# Patient Record
Sex: Female | Born: 1995 | Hispanic: Yes | Marital: Single | Smoking: Never smoker
Health system: Southern US, Community
[De-identification: ages and names within clinical notes are randomized; demographics above are authoritative.]

## PROBLEM LIST (undated history)

## (undated) ENCOUNTER — Inpatient Hospital Stay (HOSPITAL_COMMUNITY): Payer: Self-pay

## (undated) DIAGNOSIS — K8020 Calculus of gallbladder without cholecystitis without obstruction: Secondary | ICD-10-CM

## (undated) DIAGNOSIS — N83209 Unspecified ovarian cyst, unspecified side: Secondary | ICD-10-CM

## (undated) DIAGNOSIS — Z789 Other specified health status: Secondary | ICD-10-CM

---

## 2015-07-30 NOTE — L&D Delivery Note (Signed)
Delivery Note At 7:43 PM a viable female was delivered via Vaginal, Spontaneous Delivery (Presentation: Left Occiput Anterior).  APGAR: 9, 9; weight  .   Placenta status: Intact, Spontaneous.  Cord: 3 vessels with the following complications: None.  Cord pH:   Anesthesia: Epidural  Episiotomy: None Lacerations:  vaginal Suture Repair: 3.0 vicryl Est. Blood Loss (mL):    Mom to postpartum.  Baby to Couplet care / Skin to Skin.  Clemmons,Lori Grissett 11/23/2015, 8:21 PM

## 2015-08-16 ENCOUNTER — Encounter: Payer: Self-pay | Admitting: Obstetrics and Gynecology

## 2015-08-16 ENCOUNTER — Ambulatory Visit (INDEPENDENT_AMBULATORY_CARE_PROVIDER_SITE_OTHER): Payer: Self-pay | Admitting: Obstetrics and Gynecology

## 2015-08-16 VITALS — BP 107/70 | HR 96 | Ht 64.0 in | Wt 183.0 lb

## 2015-08-16 DIAGNOSIS — Z3492 Encounter for supervision of normal pregnancy, unspecified, second trimester: Secondary | ICD-10-CM

## 2015-08-16 DIAGNOSIS — Z3402 Encounter for supervision of normal first pregnancy, second trimester: Secondary | ICD-10-CM

## 2015-08-16 DIAGNOSIS — Z113 Encounter for screening for infections with a predominantly sexual mode of transmission: Secondary | ICD-10-CM

## 2015-08-16 DIAGNOSIS — Z36 Encounter for antenatal screening of mother: Secondary | ICD-10-CM

## 2015-08-16 DIAGNOSIS — O0930 Supervision of pregnancy with insufficient antenatal care, unspecified trimester: Secondary | ICD-10-CM | POA: Insufficient documentation

## 2015-08-16 DIAGNOSIS — O0932 Supervision of pregnancy with insufficient antenatal care, second trimester: Secondary | ICD-10-CM

## 2015-08-16 DIAGNOSIS — Z3482 Encounter for supervision of other normal pregnancy, second trimester: Secondary | ICD-10-CM

## 2015-08-16 DIAGNOSIS — Z3400 Encounter for supervision of normal first pregnancy, unspecified trimester: Secondary | ICD-10-CM | POA: Insufficient documentation

## 2015-08-16 NOTE — Progress Notes (Signed)
   Subjective:    Jessica Riddle is a G2P0 [redacted]w[redacted]d being seen today for her first obstetrical visit.  Her obstetrical history is significant for first pregnancy and late onset to care at 26 weeks. Patient does intend to breast feed. Pregnancy history fully reviewed.  Patient reports no complaints.  Filed Vitals:   08/16/15 0957 08/16/15 0958  BP: 107/70   Pulse: 96   Height:   (1.626 m)  Weight: 183 lb (83.008 kg)     HISTORY: OB History  Gravida Para Term Preterm AB SAB TAB Ectopic Multiple Living  2             # Outcome Date GA Lbr Len/2nd Weight Sex Delivery Anes PTL Lv  2 Current           1 Gravida              History reviewed. No pertinent past medical history. History reviewed. No pertinent past surgical history. History reviewed. No pertinent family history.   Exam    Not performed Fundal height 26    Assessment:    Pregnancy: G2P0 Patient Active Problem List   Diagnosis Date Noted  . Supervision of normal first pregnancy, antepartum 08/16/2015  . Insufficient prenatal care 08/16/2015        Plan:     Initial labs drawn. Prenatal vitamins. Problem list reviewed and updated. Genetic Screening discussed : too late.  Ultrasound discussed; fetal survey: ordered with Dr. Gaynell Face  Follow up in 2 weeks for glucola and tdap 50% of 30 min visit spent on counseling and coordination of care.     Jessica Riddle 08/16/2015

## 2015-08-16 NOTE — Patient Instructions (Addendum)
Contraception Choices Contraception (birth control) is the use of any methods or devices to prevent pregnancy. Below are some methods to help avoid pregnancy. HORMONAL METHODS   Contraceptive implant. This is a thin, plastic tube containing progesterone hormone. It does not contain estrogen hormone. Your health care provider inserts the tube in the inner part of the upper arm. The tube can remain in place for up to 3 years. After 3 years, the implant must be removed. The implant prevents the ovaries from releasing an egg (ovulation), thickens the cervical mucus to prevent sperm from entering the uterus, and thins the lining of the inside of the uterus.  Progesterone-only injections. These injections are given every 3 months by your health care provider to prevent pregnancy. This synthetic progesterone hormone stops the ovaries from releasing eggs. It also thickens cervical mucus and changes the uterine lining. This makes it harder for sperm to survive in the uterus.  Birth control pills. These pills contain estrogen and progesterone hormone. They work by preventing the ovaries from releasing eggs (ovulation). They also cause the cervical mucus to thicken, preventing the sperm from entering the uterus. Birth control pills are prescribed by a health care provider.Birth control pills can also be used to treat heavy periods.  Minipill. This type of birth control pill contains only the progesterone hormone. They are taken every day of each month and must be prescribed by your health care provider.  Birth control patch. The patch contains hormones similar to those in birth control pills. It must be changed once a week and is prescribed by a health care provider.  Vaginal ring. The ring contains hormones similar to those in birth control pills. It is left in the vagina for 3 weeks, removed for 1 week, and then a new one is put back in place. The patient must be comfortable inserting and removing the ring  from the vagina.A health care provider's prescription is necessary.  Emergency contraception. Emergency contraceptives prevent pregnancy after unprotected sexual intercourse. This pill can be taken right after sex or up to 5 days after unprotected sex. It is most effective the sooner you take the pills after having sexual intercourse. Most emergency contraceptive pills are available without a prescription. Check with your pharmacist. Do not use emergency contraception as your only form of birth control. BARRIER METHODS   Female condom. This is a thin sheath (latex or rubber) that is worn over the penis during sexual intercourse. It can be used with spermicide to increase effectiveness.  Female condom. This is a soft, loose-fitting sheath that is put into the vagina before sexual intercourse.  Diaphragm. This is a soft, latex, dome-shaped barrier that must be fitted by a health care provider. It is inserted into the vagina, along with a spermicidal jelly. It is inserted before intercourse. The diaphragm should be left in the vagina for 6 to 8 hours after intercourse.  Cervical cap. This is a round, soft, latex or plastic cup that fits over the cervix and must be fitted by a health care provider. The cap can be left in place for up to 48 hours after intercourse.  Sponge. This is a soft, circular piece of polyurethane foam. The sponge has spermicide in it. It is inserted into the vagina after wetting it and before sexual intercourse.  Spermicides. These are chemicals that kill or block sperm from entering the cervix and uterus. They come in the form of creams, jellies, suppositories, foam, or tablets. They do not require a   prescription. They are inserted into the vagina with an applicator before having sexual intercourse. The process must be repeated every time you have sexual intercourse. INTRAUTERINE CONTRACEPTION  Intrauterine device (IUD). This is a T-shaped device that is put in a woman's uterus  during a menstrual period to prevent pregnancy. There are 2 types:  Copper IUD. This type of IUD is wrapped in copper wire and is placed inside the uterus. Copper makes the uterus and fallopian tubes produce a fluid that kills sperm. It can stay in place for 10 years.  Hormone IUD. This type of IUD contains the hormone progestin (synthetic progesterone). The hormone thickens the cervical mucus and prevents sperm from entering the uterus, and it also thins the uterine lining to prevent implantation of a fertilized egg. The hormone can weaken or kill the sperm that get into the uterus. It can stay in place for 3-5 years, depending on which type of IUD is used. PERMANENT METHODS OF CONTRACEPTION  Female tubal ligation. This is when the woman's fallopian tubes are surgically sealed, tied, or blocked to prevent the egg from traveling to the uterus.  Hysteroscopic sterilization. This involves placing a small coil or insert into each fallopian tube. Your doctor uses a technique called hysteroscopy to do the procedure. The device causes scar tissue to form. This results in permanent blockage of the fallopian tubes, so the sperm cannot fertilize the egg. It takes about 3 months after the procedure for the tubes to become blocked. You must use another form of birth control for these 3 months.  Female sterilization. This is when the female has the tubes that carry sperm tied off (vasectomy).This blocks sperm from entering the vagina during sexual intercourse. After the procedure, the man can still ejaculate fluid (semen). NATURAL PLANNING METHODS  Natural family planning. This is not having sexual intercourse or using a barrier method (condom, diaphragm, cervical cap) on days the woman could become pregnant.  Calendar method. This is keeping track of the length of each menstrual cycle and identifying when you are fertile.  Ovulation method. This is avoiding sexual intercourse during ovulation.  Symptothermal  method. This is avoiding sexual intercourse during ovulation, using a thermometer and ovulation symptoms.  Post-ovulation method. This is timing sexual intercourse after you have ovulated. Regardless of which type or method of contraception you choose, it is important that you use condoms to protect against the transmission of sexually transmitted infections (STIs). Talk with your health care provider about which form of contraception is most appropriate for you.   This information is not intended to replace advice given to you by your health care provider. Make sure you discuss any questions you have with your health care provider.   Document Released: 07/15/2005 Document Revised: 07/20/2013 Document Reviewed: 01/07/2013 Elsevier Interactive Patient Education 2016 [employer].  Second Trimester of Pregnancy The second trimester is from week 13 through week 28, months 4 through 6. The second trimester is often a time when you feel your best. Your body has also adjusted to being pregnant, and you begin to feel better physically. Usually, morning sickness has lessened or quit completely, you may have more energy, and you may have an increase in appetite. The second trimester is also a time when the fetus is growing rapidly. At the end of the sixth month, the fetus is about 9 inches long and weighs about 1 pounds. You will likely begin to feel the baby move (quickening) between 18 and 20 weeks of the  pregnancy. BODY CHANGES Your body goes through many changes during pregnancy. The changes vary from woman to woman.   Your weight will continue to increase. You will notice your lower abdomen bulging out.  You may begin to get stretch marks on your hips, abdomen, and breasts.  You may develop headaches that can be relieved by medicines approved by your health care provider.  You may urinate more often because the fetus is pressing on your bladder.  You may develop or continue to have heartburn as  a result of your pregnancy.  You may develop constipation because certain hormones are causing the muscles that push waste through your intestines to slow down.  You may develop hemorrhoids or swollen, bulging veins (varicose veins).  You may have back pain because of the weight gain and pregnancy hormones relaxing your joints between the bones in your pelvis and as a result of a shift in weight and the muscles that support your balance.  Your breasts will continue to grow and be tender.  Your gums may bleed and may be sensitive to brushing and flossing.  Dark spots or blotches (chloasma, mask of pregnancy) may develop on your face. This will likely fade after the baby is born.  A dark line from your belly button to the pubic area (linea nigra) may appear. This will likely fade after the baby is born.  You may have changes in your hair. These can include thickening of your hair, rapid growth, and changes in texture. Some women also have hair loss during or after pregnancy, or hair that feels dry or thin. Your hair will most likely return to normal after your baby is born. WHAT TO EXPECT AT YOUR PRENATAL VISITS During a routine prenatal visit:  You will be weighed to make sure you and the fetus are growing normally.  Your blood pressure will be taken.  Your abdomen will be measured to track your baby's growth.  The fetal heartbeat will be listened to.  Any test results from the previous visit will be discussed. Your health care provider may ask you:  How you are feeling.  If you are feeling the baby move.  If you have had any abnormal symptoms, such as leaking fluid, bleeding, severe headaches, or abdominal cramping.  If you are using any tobacco products, including cigarettes, chewing tobacco, and electronic cigarettes.  If you have any questions. Other tests that may be performed during your second trimester include:  Blood tests that check for:  Low iron levels  (anemia).  Gestational diabetes (between 24 and 28 weeks).  Rh antibodies.  Urine tests to check for infections, diabetes, or protein in the urine.  An ultrasound to confirm the proper growth and development of the baby.  An amniocentesis to check for possible genetic problems.  Fetal screens for spina bifida and Down syndrome.  HIV (human immunodeficiency virus) testing. Routine prenatal testing includes screening for HIV, unless you choose not to have this test. HOME CARE INSTRUCTIONS   Avoid all smoking, herbs, alcohol, and unprescribed drugs. These chemicals affect the formation and growth of the baby.  Do not use any tobacco products, including cigarettes, chewing tobacco, and electronic cigarettes. If you need help quitting, ask your health care provider. You may receive counseling support and other resources to help you quit.  Follow your health care provider's instructions regarding medicine use. There are medicines that are either safe or unsafe to take during pregnancy.  Exercise only as directed by your health  care provider. Experiencing uterine cramps is a good sign to stop exercising.  Continue to eat regular, healthy meals.  Wear a good support bra for breast tenderness.  Do not use hot tubs, steam rooms, or saunas.  Wear your seat belt at all times when driving.  Avoid raw meat, uncooked cheese, cat litter boxes, and soil used by cats. These carry germs that can cause birth defects in the baby.  Take your prenatal vitamins.  Take 1500-2000 mg of calcium daily starting at the 20th week of pregnancy until you deliver your baby.  Try taking a stool softener (if your health care provider approves) if you develop constipation. Eat more high-fiber foods, such as fresh vegetables or fruit and whole grains. Drink plenty of fluids to keep your urine clear or pale yellow.  Take warm sitz baths to soothe any pain or discomfort caused by hemorrhoids. Use hemorrhoid cream  if your health care provider approves.  If you develop varicose veins, wear support hose. Elevate your feet for 15 minutes, 3-4 times a day. Limit salt in your diet.  Avoid heavy lifting, wear low heel shoes, and practice good posture.  Rest with your legs elevated if you have leg cramps or low back pain.  Visit your dentist if you have not gone yet during your pregnancy. Use a soft toothbrush to brush your teeth and be gentle when you floss.  A sexual relationship may be continued unless your health care provider directs you otherwise.  Continue to go to all your prenatal visits as directed by your health care provider. SEEK MEDICAL CARE IF:   You have dizziness.  You have mild pelvic cramps, pelvic pressure, or nagging pain in the abdominal area.  You have persistent nausea, vomiting, or diarrhea.  You have a bad smelling vaginal discharge.  You have pain with urination. SEEK IMMEDIATE MEDICAL CARE IF:   You have a fever.  You are leaking fluid from your vagina.  You have spotting or bleeding from your vagina.  You have severe abdominal cramping or pain.  You have rapid weight gain or loss.  You have shortness of breath with chest pain.  You notice sudden or extreme swelling of your face, hands, ankles, feet, or legs.  You have not felt your baby move in over an hour.  You have severe headaches that do not go away with medicine.  You have vision changes.   This information is not intended to replace advice given to you by your health care provider. Make sure you discuss any questions you have with your health care provider.   Document Released: 07/09/2001 Document Revised: 08/05/2014 Document Reviewed: 09/15/2012 Elsevier Interactive Patient Education [employer]  Contraception Choices Contraception (birth control) is the use of any methods or devices to prevent pregnancy. Below are some methods to help avoid pregnancy. HORMONAL METHODS   Contraceptive  implant. This is a thin, plastic tube containing progesterone hormone. It does not contain estrogen hormone. Your health care provider inserts the tube in the inner part of the upper arm. The tube can remain in place for up to 3 years. After 3 years, the implant must be removed. The implant prevents the ovaries from releasing an egg (ovulation), thickens the cervical mucus to prevent sperm from entering the uterus, and thins the lining of the inside of the uterus.  Progesterone-only injections. These injections are given every 3 months by your health care provider to prevent pregnancy. This synthetic progesterone hormone stops the ovaries  from releasing eggs. It also thickens cervical mucus and changes the uterine lining. This makes it harder for sperm to survive in the uterus.  Birth control pills. These pills contain estrogen and progesterone hormone. They work by preventing the ovaries from releasing eggs (ovulation). They also cause the cervical mucus to thicken, preventing the sperm from entering the uterus. Birth control pills are prescribed by a health care provider.Birth control pills can also be used to treat heavy periods.  Minipill. This type of birth control pill contains only the progesterone hormone. They are taken every day of each month and must be prescribed by your health care provider.  Birth control patch. The patch contains hormones similar to those in birth control pills. It must be changed once a week and is prescribed by a health care provider.  Vaginal ring. The ring contains hormones similar to those in birth control pills. It is left in the vagina for 3 weeks, removed for 1 week, and then a new one is put back in place. The patient must be comfortable inserting and removing the ring from the vagina.A health care provider's prescription is necessary.  Emergency contraception. Emergency contraceptives prevent pregnancy after unprotected sexual intercourse. This pill can be  taken right after sex or up to 5 days after unprotected sex. It is most effective the sooner you take the pills after having sexual intercourse. Most emergency contraceptive pills are available without a prescription. Check with your pharmacist. Do not use emergency contraception as your only form of birth control. BARRIER METHODS   Female condom. This is a thin sheath (latex or rubber) that is worn over the penis during sexual intercourse. It can be used with spermicide to increase effectiveness.  Female condom. This is a soft, loose-fitting sheath that is put into the vagina before sexual intercourse.  Diaphragm. This is a soft, latex, dome-shaped barrier that must be fitted by a health care provider. It is inserted into the vagina, along with a spermicidal jelly. It is inserted before intercourse. The diaphragm should be left in the vagina for 6 to 8 hours after intercourse.  Cervical cap. This is a round, soft, latex or plastic cup that fits over the cervix and must be fitted by a health care provider. The cap can be left in place for up to 48 hours after intercourse.  Sponge. This is a soft, circular piece of polyurethane foam. The sponge has spermicide in it. It is inserted into the vagina after wetting it and before sexual intercourse.  Spermicides. These are chemicals that kill or block sperm from entering the cervix and uterus. They come in the form of creams, jellies, suppositories, foam, or tablets. They do not require a prescription. They are inserted into the vagina with an applicator before having sexual intercourse. The process must be repeated every time you have sexual intercourse. INTRAUTERINE CONTRACEPTION  Intrauterine device (IUD). This is a T-shaped device that is put in a woman's uterus during a menstrual period to prevent pregnancy. There are 2 types:  Copper IUD. This type of IUD is wrapped in copper wire and is placed inside the uterus. Copper makes the uterus and fallopian  tubes produce a fluid that kills sperm. It can stay in place for 10 years.  Hormone IUD. This type of IUD contains the hormone progestin (synthetic progesterone). The hormone thickens the cervical mucus and prevents sperm from entering the uterus, and it also thins the uterine lining to prevent implantation of a fertilized egg. The  hormone can weaken or kill the sperm that get into the uterus. It can stay in place for 3-5 years, depending on which type of IUD is used. PERMANENT METHODS OF CONTRACEPTION  Female tubal ligation. This is when the woman's fallopian tubes are surgically sealed, tied, or blocked to prevent the egg from traveling to the uterus.  Hysteroscopic sterilization. This involves placing a small coil or insert into each fallopian tube. Your doctor uses a technique called hysteroscopy to do the procedure. The device causes scar tissue to form. This results in permanent blockage of the fallopian tubes, so the sperm cannot fertilize the egg. It takes about 3 months after the procedure for the tubes to become blocked. You must use another form of birth control for these 3 months.  Female sterilization. This is when the female has the tubes that carry sperm tied off (vasectomy).This blocks sperm from entering the vagina during sexual intercourse. After the procedure, the man can still ejaculate fluid (semen). NATURAL PLANNING METHODS  Natural family planning. This is not having sexual intercourse or using a barrier method (condom, diaphragm, cervical cap) on days the woman could become pregnant.  Calendar method. This is keeping track of the length of each menstrual cycle and identifying when you are fertile.  Ovulation method. This is avoiding sexual intercourse during ovulation.  Symptothermal method. This is avoiding sexual intercourse during ovulation, using a thermometer and ovulation symptoms.  Post-ovulation method. This is timing sexual intercourse after you have  ovulated. Regardless of which type or method of contraception you choose, it is important that you use condoms to protect against the transmission of sexually transmitted infections (STIs). Talk with your health care provider about which form of contraception is most appropriate for you.   This information is not intended to replace advice given to you by your health care provider. Make sure you discuss any questions you have with your health care provider.   Document Released: 07/15/2005 Document Revised: 07/20/2013 Document Reviewed: 01/07/2013 Elsevier Interactive Patient Education [employer]  Breastfeeding Deciding to breastfeed is one of the best choices you can make for you and your baby. A change in hormones during pregnancy causes your breast tissue to grow and increases the number and size of your milk ducts. These hormones also allow proteins, sugars, and fats from your blood supply to make breast milk in your milk-producing glands. Hormones prevent breast milk from being released before your baby is born as well as prompt milk flow after birth. Once breastfeeding has begun, thoughts of your baby, as well as his or her sucking or crying, can stimulate the release of milk from your milk-producing glands.  BENEFITS OF BREASTFEEDING For Your Baby  Your first milk (colostrum) helps your baby's digestive system function better.  There are antibodies in your milk that help your baby fight off infections.  Your baby has a lower incidence of asthma, allergies, and sudden infant death syndrome.  The nutrients in breast milk are better for your baby than infant formulas and are designed uniquely for your baby's needs.  Breast milk improves your baby's brain development.  Your baby is less likely to develop other conditions, such as childhood obesity, asthma, or type 2 diabetes mellitus. For You  Breastfeeding helps to create a very special bond between you and your  baby.  Breastfeeding is convenient. Breast milk is always available at the correct temperature and costs nothing.  Breastfeeding helps to burn calories and helps you lose the weight  gained during pregnancy.  Breastfeeding makes your uterus contract to its prepregnancy size faster and slows bleeding (lochia) after you give birth.   Breastfeeding helps to lower your risk of developing type 2 diabetes mellitus, osteoporosis, and breast or ovarian cancer later in life. SIGNS THAT YOUR BABY IS HUNGRY Early Signs of Hunger  Increased alertness or activity.  Stretching.  Movement of the head from side to side.  Movement of the head and opening of the mouth when the corner of the mouth or cheek is stroked (rooting).  Increased sucking sounds, smacking lips, cooing, sighing, or squeaking.  Hand-to-mouth movements.  Increased sucking of fingers or hands. Late Signs of Hunger  Fussing.  Intermittent crying. Extreme Signs of Hunger Signs of extreme hunger will require calming and consoling before your baby will be able to breastfeed successfully. Do not wait for the following signs of extreme hunger to occur before you initiate breastfeeding:  Restlessness.  A loud, strong cry.  Screaming. BREASTFEEDING BASICS Breastfeeding Initiation  Find a comfortable place to sit or lie down, with your neck and back well supported.  Place a pillow or rolled up blanket under your baby to bring him or her to the level of your breast (if you are seated). Nursing pillows are specially designed to help support your arms and your baby while you breastfeed.  Make sure that your baby's abdomen is facing your abdomen.  Gently massage your breast. With your fingertips, massage from your chest wall toward your nipple in a circular motion. This encourages milk flow. You may need to continue this action during the feeding if your milk flows slowly.  Support your breast with 4 fingers underneath and your  thumb above your nipple. Make sure your fingers are well away from your nipple and your baby's mouth.  Stroke your baby's lips gently with your finger or nipple.  When your baby's mouth is open wide enough, quickly bring your baby to your breast, placing your entire nipple and as much of the colored area around your nipple (areola) as possible into your baby's mouth.  More areola should be visible above your baby's upper lip than below the lower lip.  Your baby's tongue should be between his or her lower gum and your breast.  Ensure that your baby's mouth is correctly positioned around your nipple (latched). Your baby's lips should create a seal on your breast and be turned out (everted).  It is common for your baby to suck about 2-3 minutes in order to start the flow of breast milk. Latching Teaching your baby how to latch on to your breast properly is very important. An improper latch can cause nipple pain and decreased milk supply for you and poor weight gain in your baby. Also, if your baby is not latched onto your nipple properly, he or she may swallow some air during feeding. This can make your baby fussy. Burping your baby when you switch breasts during the feeding can help to get rid of the air. However, teaching your baby to latch on properly is still the best way to prevent fussiness from swallowing air while breastfeeding. Signs that your baby has successfully latched on to your nipple:  Silent tugging or silent sucking, without causing you pain.  Swallowing heard between every 3-4 sucks.  Muscle movement above and in front of his or her ears while sucking. Signs that your baby has not successfully latched on to nipple:  Sucking sounds or smacking sounds from your baby  while breastfeeding.  Nipple pain. If you think your baby has not latched on correctly, slip your finger into the corner of your baby's mouth to break the suction and place it between your baby's gums. Attempt  breastfeeding initiation again. Signs of Successful Breastfeeding Signs from your baby:  A gradual decrease in the number of sucks or complete cessation of sucking.  Falling asleep.  Relaxation of his or her body.  Retention of a small amount of milk in his or her mouth.  Letting go of your breast by himself or herself. Signs from you:  Breasts that have increased in firmness, weight, and size 1-3 hours after feeding.  Breasts that are softer immediately after breastfeeding.  Increased milk volume, as well as a change in milk consistency and color by the fifth day of breastfeeding.  Nipples that are not sore, cracked, or bleeding. Signs That Your Pecola Leisure is Getting Enough Milk  Wetting at least 3 diapers in a 24-hour period. The urine should be clear and pale yellow by age 215 days.  At least 3 stools in a 24-hour period by age 215 days. The stool should be soft and yellow.  At least 3 stools in a 24-hour period by age 21 days. The stool should be seedy and yellow.  No loss of weight greater than 10% of birth weight during the first 81 days of age.  Average weight gain of 4-7 ounces (113-198 g) per week after age 69 days.  Consistent daily weight gain by age 215 days, without weight loss after the age of 2 weeks. After a feeding, your baby may spit up a small amount. This is common. BREASTFEEDING FREQUENCY AND DURATION Frequent feeding will help you make more milk and can prevent sore nipples and breast engorgement. Breastfeed when you feel the need to reduce the fullness of your breasts or when your baby shows signs of hunger. This is called "breastfeeding on demand." Avoid introducing a pacifier to your baby while you are working to establish breastfeeding (the first 4-6 weeks after your baby is born). After this time you may choose to use a pacifier. Research has shown that pacifier use during the first year of a baby's life decreases the risk of sudden infant death syndrome  (SIDS). Allow your baby to feed on each breast as long as he or she wants. Breastfeed until your baby is finished feeding. When your baby unlatches or falls asleep while feeding from the first breast, offer the second breast. Because newborns are often sleepy in the first few weeks of life, you may need to awaken your baby to get him or her to feed. Breastfeeding times will vary from baby to baby. However, the following rules can serve as a guide to help you ensure that your baby is properly fed:  Newborns (babies 19 weeks of age or younger) may breastfeed every 1-3 hours.  Newborns should not go longer than 3 hours during the day or 5 hours during the night without breastfeeding.  You should breastfeed your baby a minimum of 8 times in a 24-hour period until you begin to introduce solid foods to your baby at around 34 months of age. BREAST MILK PUMPING Pumping and storing breast milk allows you to ensure that your baby is exclusively fed your breast milk, even at times when you are unable to breastfeed. This is especially important if you are going back to work while you are still breastfeeding or when you are not able to be  present during feedings. Your lactation consultant can give you guidelines on how long it is safe to store breast milk. A breast pump is a machine that allows you to pump milk from your breast into a sterile bottle. The pumped breast milk can then be stored in a refrigerator or freezer. Some breast pumps are operated by hand, while others use electricity. Ask your lactation consultant which type will work best for you. Breast pumps can be purchased, but some hospitals and breastfeeding support groups lease breast pumps on a monthly basis. A lactation consultant can teach you how to hand express breast milk, if you prefer not to use a pump. CARING FOR YOUR BREASTS WHILE YOU BREASTFEED Nipples can become dry, cracked, and sore while breastfeeding. The following recommendations can help  keep your breasts moisturized and healthy:  Avoid using soap on your nipples.  Wear a supportive bra. Although not required, special nursing bras and tank tops are designed to allow access to your breasts for breastfeeding without taking off your entire bra or top. Avoid wearing underwire-style bras or extremely tight bras.  Air dry your nipples for 3-57minutes after each feeding.  Use only cotton bra pads to absorb leaked breast milk. Leaking of breast milk between feedings is normal.  Use lanolin on your nipples after breastfeeding. Lanolin helps to maintain your skin's normal moisture barrier. If you use pure lanolin, you do not need to wash it off before feeding your baby again. Pure lanolin is not toxic to your baby. You may also hand express a few drops of breast milk and gently massage that milk into your nipples and allow the milk to air dry. In the first few weeks after giving birth, some women experience extremely full breasts (engorgement). Engorgement can make your breasts feel heavy, warm, and tender to the touch. Engorgement peaks within 3-5 days after you give birth. The following recommendations can help ease engorgement:  Completely empty your breasts while breastfeeding or pumping. You may want to start by applying warm, moist heat (in the shower or with warm water-soaked hand towels) just before feeding or pumping. This increases circulation and helps the milk flow. If your baby does not completely empty your breasts while breastfeeding, pump any extra milk after he or she is finished.  Wear a snug bra (nursing or regular) or tank top for 1-2 days to signal your body to slightly decrease milk production.  Apply ice packs to your breasts, unless this is too uncomfortable for you.  Make sure that your baby is latched on and positioned properly while breastfeeding. If engorgement persists after 48 hours of following these recommendations, contact your health care provider or a  Advertising copywriter. OVERALL HEALTH CARE RECOMMENDATIONS WHILE BREASTFEEDING  Eat healthy foods. Alternate between meals and snacks, eating 3 of each per day. Because what you eat affects your breast milk, some of the foods may make your baby more irritable than usual. Avoid eating these foods if you are sure that they are negatively affecting your baby.  Drink milk, fruit juice, and water to satisfy your thirst (about 10 glasses a day).  Rest often, relax, and continue to take your prenatal vitamins to prevent fatigue, stress, and anemia.  Continue breast self-awareness checks.  Avoid chewing and smoking tobacco. Chemicals from cigarettes that pass into breast milk and exposure to secondhand smoke may harm your baby.  Avoid alcohol and drug use, including marijuana. Some medicines that may be harmful to your baby can pass through  breast milk. It is important to ask your health care provider before taking any medicine, including all over-the-counter and prescription medicine as well as vitamin and herbal supplements. It is possible to become pregnant while breastfeeding. If birth control is desired, ask your health care provider about options that will be safe for your baby. SEEK MEDICAL CARE IF:  You feel like you want to stop breastfeeding or have become frustrated with breastfeeding.  You have painful breasts or nipples.  Your nipples are cracked or bleeding.  Your breasts are red, tender, or warm.  You have a swollen area on either breast.  You have a fever or chills.  You have nausea or vomiting.  You have drainage other than breast milk from your nipples.  Your breasts do not become full before feedings by the fifth day after you give birth.  You feel sad and depressed.  Your baby is too sleepy to eat well.  Your baby is having trouble sleeping.   Your baby is wetting less than 3 diapers in a 24-hour period.  Your baby has less than 3 stools in a 24-hour  period.  Your baby's skin or the white part of his or her eyes becomes yellow.   Your baby is not gaining weight by 53 days of age. SEEK IMMEDIATE MEDICAL CARE IF:  Your baby is overly tired (lethargic) and does not want to wake up and feed.  Your baby develops an unexplained fever.   This information is not intended to replace advice given to you by your health care provider. Make sure you discuss any questions you have with your health care provider.   Document Released: 07/15/2005 Document Revised: 04/05/2015 Document Reviewed: 01/06/2013 Elsevier Interactive Patient Education [employer]

## 2015-08-17 LAB — PRENATAL PROFILE (SOLSTAS)
Antibody Screen: NEGATIVE
BASOS PCT: 0 % (ref 0–1)
Basophils Absolute: 0 10*3/uL (ref 0.0–0.1)
Eosinophils Absolute: 0.1 10*3/uL (ref 0.0–0.7)
Eosinophils Relative: 1 % (ref 0–5)
HEMATOCRIT: 30.1 % — AB (ref 36.0–46.0)
HEMOGLOBIN: 9.9 g/dL — AB (ref 12.0–15.0)
HEP B S AG: NEGATIVE
HIV: NONREACTIVE
LYMPHS PCT: 17 % (ref 12–46)
Lymphs Abs: 2.4 10*3/uL (ref 0.7–4.0)
MCH: 28.3 pg (ref 26.0–34.0)
MCHC: 32.9 g/dL (ref 30.0–36.0)
MCV: 86 fL (ref 78.0–100.0)
MONO ABS: 0.7 10*3/uL (ref 0.1–1.0)
MONOS PCT: 5 % (ref 3–12)
MPV: 10.9 fL (ref 8.6–12.4)
NEUTROS ABS: 10.9 10*3/uL — AB (ref 1.7–7.7)
NEUTROS PCT: 77 % (ref 43–77)
Platelets: 285 10*3/uL (ref 150–400)
RBC: 3.5 MIL/uL — AB (ref 3.87–5.11)
RDW: 14.7 % (ref 11.5–15.5)
Rh Type: POSITIVE
Rubella: 7.73 Index — ABNORMAL HIGH (ref <0.90)
WBC: 14.2 10*3/uL — AB (ref 4.0–10.5)

## 2015-08-17 LAB — CULTURE, OB URINE
Colony Count: NO GROWTH
Organism ID, Bacteria: NO GROWTH

## 2015-08-17 LAB — URINE CYTOLOGY ANCILLARY ONLY
Chlamydia: NEGATIVE
Neisseria Gonorrhea: NEGATIVE

## 2015-08-30 ENCOUNTER — Encounter: Payer: Self-pay | Admitting: Certified Nurse Midwife

## 2015-08-31 ENCOUNTER — Encounter: Payer: Self-pay | Admitting: *Deleted

## 2015-08-31 ENCOUNTER — Encounter: Payer: Self-pay | Admitting: Obstetrics and Gynecology

## 2015-08-31 ENCOUNTER — Ambulatory Visit (INDEPENDENT_AMBULATORY_CARE_PROVIDER_SITE_OTHER): Payer: Self-pay | Admitting: Obstetrics and Gynecology

## 2015-08-31 VITALS — BP 113/74 | HR 103 | Wt 188.0 lb

## 2015-08-31 DIAGNOSIS — Z3483 Encounter for supervision of other normal pregnancy, third trimester: Secondary | ICD-10-CM

## 2015-08-31 DIAGNOSIS — Z36 Encounter for antenatal screening of mother: Secondary | ICD-10-CM

## 2015-08-31 DIAGNOSIS — Z3493 Encounter for supervision of normal pregnancy, unspecified, third trimester: Secondary | ICD-10-CM

## 2015-08-31 DIAGNOSIS — Z23 Encounter for immunization: Secondary | ICD-10-CM

## 2015-08-31 DIAGNOSIS — O0933 Supervision of pregnancy with insufficient antenatal care, third trimester: Secondary | ICD-10-CM

## 2015-08-31 DIAGNOSIS — Z3403 Encounter for supervision of normal first pregnancy, third trimester: Secondary | ICD-10-CM

## 2015-08-31 LAB — CBC
HEMATOCRIT: 30.2 % — AB (ref 36.0–46.0)
Hemoglobin: 10 g/dL — ABNORMAL LOW (ref 12.0–15.0)
MCH: 28.2 pg (ref 26.0–34.0)
MCHC: 33.1 g/dL (ref 30.0–36.0)
MCV: 85.1 fL (ref 78.0–100.0)
MPV: 10.2 fL (ref 8.6–12.4)
PLATELETS: 296 10*3/uL (ref 150–400)
RBC: 3.55 MIL/uL — AB (ref 3.87–5.11)
RDW: 13.8 % (ref 11.5–15.5)
WBC: 13.3 10*3/uL — AB (ref 4.0–10.5)

## 2015-08-31 NOTE — Progress Notes (Addendum)
Subjective:  Jessica Riddle is a 20 y.o. G2P0 at [redacted]w[redacted]d being seen today for ongoing prenatal care.  She is currently monitored for the following issues for this high-risk pregnancy and has Supervision of normal first pregnancy, antepartum and Insufficient prenatal care on her problem list.  Patient reports no complaints.  Contractions: Not present. Vag. Bleeding: None.  Movement: Present. Denies leaking of fluid.   The following portions of the patient's history were reviewed and updated as appropriate: allergies, current medications, past family history, past medical history, past social history, past surgical history and problem list. Problem list updated.  Objective:   Filed Vitals:   08/31/15 0852  BP: 113/74  Pulse: 103  Weight: 188 lb (85.276 kg)    Fetal Status: Fetal Heart Rate (bpm): 147   Movement: Present     General:  Alert, oriented and cooperative. Patient is in no acute distress.  Skin: Skin is warm and dry. No rash noted.   Cardiovascular: Normal heart rate noted  Respiratory: Normal respiratory effort, no problems with respiration noted  Abdomen: Soft, gravid, appropriate for gestational age. Pain/Pressure: Absent     Pelvic: Vag. Bleeding: None Vag D/C Character: Thin   Cervical exam deferred        Extremities: Normal range of motion.  Edema: None  Mental Status: Normal mood and affect. Normal behavior. Normal judgment and thought content.   Urinalysis: Urine Protein: Negative Urine Glucose: Negative  Assessment and Plan:  Pregnancy: G2P0 at [redacted]w[redacted]d  1. Supervision of normal pregnancy in third trimester Patient doing well. 1 hour GCT and labs today - Tdap vaccine greater than or equal to 7yo IM - CBC - RPR - HIV antibody - Glucose Tolerance, 1 HR (50g)  2. Insufficient prenatal care, third trimester   3. Supervision of normal first pregnancy, antepartum, third trimester   Preterm labor symptoms and general obstetric precautions including but not limited to  vaginal bleeding, contractions, leaking of fluid and fetal movement were reviewed in detail with the patient. Please refer to After Visit Summary for other counseling recommendations.  Return in about 2 weeks (around 09/14/2015).   Catalina Antigua, MD

## 2015-09-01 LAB — GLUCOSE TOLERANCE, 1 HOUR (50G) W/O FASTING: GLUCOSE 1 HOUR GTT: 93 mg/dL (ref 70–140)

## 2015-09-01 LAB — HIV ANTIBODY (ROUTINE TESTING W REFLEX): HIV: NONREACTIVE

## 2015-09-01 LAB — RPR

## 2015-09-13 ENCOUNTER — Encounter: Payer: Self-pay | Admitting: Certified Nurse Midwife

## 2015-09-19 ENCOUNTER — Encounter: Payer: Self-pay | Admitting: Obstetrics and Gynecology

## 2015-09-19 ENCOUNTER — Ambulatory Visit (INDEPENDENT_AMBULATORY_CARE_PROVIDER_SITE_OTHER): Payer: Self-pay | Admitting: Obstetrics and Gynecology

## 2015-09-19 VITALS — BP 117/73 | HR 112 | Wt 186.0 lb

## 2015-09-19 DIAGNOSIS — Z3403 Encounter for supervision of normal first pregnancy, third trimester: Secondary | ICD-10-CM

## 2015-09-19 DIAGNOSIS — O0933 Supervision of pregnancy with insufficient antenatal care, third trimester: Secondary | ICD-10-CM

## 2015-09-19 NOTE — Progress Notes (Signed)
Subjective:  Jessica Riddle is a 20 y.o. G2P0 at [redacted]w[redacted]d being seen today for ongoing prenatal care.  She is currently monitored for the following issues for this low-risk pregnancy and has Supervision of normal first pregnancy, antepartum and Insufficient prenatal care on her problem list.  Patient reports no complaints.  Contractions: Not present. Vag. Bleeding: None.  Movement: Present. Denies leaking of fluid.   The following portions of the patient's history were reviewed and updated as appropriate: allergies, current medications, past family history, past medical history, past social history, past surgical history and problem list. Problem list updated.  Objective:   Filed Vitals:   09/19/15 1514  BP: 117/73  Pulse: 112  Weight: 186 lb (84.369 kg)    Fetal Status: Fetal Heart Rate (bpm): 138 Fundal Height: 31 cm Movement: Present     General:  Alert, oriented and cooperative. Patient is in no acute distress.  Skin: Skin is warm and dry. No rash noted.   Cardiovascular: Normal heart rate noted  Respiratory: Normal respiratory effort, no problems with respiration noted  Abdomen: Soft, gravid, appropriate for gestational age. Pain/Pressure: Present     Pelvic: Vag. Bleeding: None     Cervical exam deferred        Extremities: Normal range of motion.  Edema: Trace  Mental Status: Normal mood and affect. Normal behavior. Normal judgment and thought content.   Urinalysis:      Assessment and Plan:  Pregnancy: G2P0 at [redacted]w[redacted]d  1. Insufficient prenatal care, third trimester   2. Supervision of normal first pregnancy, antepartum, third trimester Patient doing well without complaints.  Normal 1 hr at last visit  Preterm labor symptoms and general obstetric precautions including but not limited to vaginal bleeding, contractions, leaking of fluid and fetal movement were reviewed in detail with the patient. Please refer to After Visit Summary for other counseling recommendations.  Return  in about 2 weeks (around 10/03/2015).   Catalina Antigua, MD

## 2015-10-03 ENCOUNTER — Ambulatory Visit (INDEPENDENT_AMBULATORY_CARE_PROVIDER_SITE_OTHER): Payer: Self-pay | Admitting: Obstetrics & Gynecology

## 2015-10-03 ENCOUNTER — Encounter (INDEPENDENT_AMBULATORY_CARE_PROVIDER_SITE_OTHER): Payer: Self-pay

## 2015-10-03 ENCOUNTER — Encounter: Payer: Self-pay | Admitting: Obstetrics & Gynecology

## 2015-10-03 VITALS — BP 100/67 | HR 108 | Wt 180.0 lb

## 2015-10-03 DIAGNOSIS — Z3403 Encounter for supervision of normal first pregnancy, third trimester: Secondary | ICD-10-CM

## 2015-10-03 DIAGNOSIS — O0933 Supervision of pregnancy with insufficient antenatal care, third trimester: Secondary | ICD-10-CM

## 2015-10-03 NOTE — Patient Instructions (Signed)
Contraception Choices Contraception (birth control) is the use of any methods or devices to prevent pregnancy. Below are some methods to help avoid pregnancy. HORMONAL METHODS   Contraceptive implant. This is a thin, plastic tube containing progesterone hormone. It does not contain estrogen hormone. Your health care provider inserts the tube in the inner part of the upper arm. The tube can remain in place for up to 3 years. After 3 years, the implant must be removed. The implant prevents the ovaries from releasing an egg (ovulation), thickens the cervical mucus to prevent sperm from entering the uterus, and thins the lining of the inside of the uterus.  Progesterone-only injections. These injections are given every 3 months by your health care provider to prevent pregnancy. This synthetic progesterone hormone stops the ovaries from releasing eggs. It also thickens cervical mucus and changes the uterine lining. This makes it harder for sperm to survive in the uterus.  Birth control pills. These pills contain estrogen and progesterone hormone. They work by preventing the ovaries from releasing eggs (ovulation). They also cause the cervical mucus to thicken, preventing the sperm from entering the uterus. Birth control pills are prescribed by a health care provider.Birth control pills can also be used to treat heavy periods.  Minipill. This type of birth control pill contains only the progesterone hormone. They are taken every day of each month and must be prescribed by your health care provider.  Birth control patch. The patch contains hormones similar to those in birth control pills. It must be changed once a week and is prescribed by a health care provider.  Vaginal ring. The ring contains hormones similar to those in birth control pills. It is left in the vagina for 3 weeks, removed for 1 week, and then a new one is put back in place. The patient must be comfortable inserting and removing the ring  from the vagina.A health care provider's prescription is necessary.  Emergency contraception. Emergency contraceptives prevent pregnancy after unprotected sexual intercourse. This pill can be taken right after sex or up to 5 days after unprotected sex. It is most effective the sooner you take the pills after having sexual intercourse. Most emergency contraceptive pills are available without a prescription. Check with your pharmacist. Do not use emergency contraception as your only form of birth control. BARRIER METHODS   Female condom. This is a thin sheath (latex or rubber) that is worn over the penis during sexual intercourse. It can be used with spermicide to increase effectiveness.  Female condom. This is a soft, loose-fitting sheath that is put into the vagina before sexual intercourse.  Diaphragm. This is a soft, latex, dome-shaped barrier that must be fitted by a health care provider. It is inserted into the vagina, along with a spermicidal jelly. It is inserted before intercourse. The diaphragm should be left in the vagina for 6 to 8 hours after intercourse.  Cervical cap. This is a round, soft, latex or plastic cup that fits over the cervix and must be fitted by a health care provider. The cap can be left in place for up to 48 hours after intercourse.  Sponge. This is a soft, circular piece of polyurethane foam. The sponge has spermicide in it. It is inserted into the vagina after wetting it and before sexual intercourse.  Spermicides. These are chemicals that kill or block sperm from entering the cervix and uterus. They come in the form of creams, jellies, suppositories, foam, or tablets. They do not require a   prescription. They are inserted into the vagina with an applicator before having sexual intercourse. The process must be repeated every time you have sexual intercourse. INTRAUTERINE CONTRACEPTION  Intrauterine device (IUD). This is a T-shaped device that is put in a woman's uterus  during a menstrual period to prevent pregnancy. There are 2 types:  Copper IUD. This type of IUD is wrapped in copper wire and is placed inside the uterus. Copper makes the uterus and fallopian tubes produce a fluid that kills sperm. It can stay in place for 10 years.  Hormone IUD. This type of IUD contains the hormone progestin (synthetic progesterone). The hormone thickens the cervical mucus and prevents sperm from entering the uterus, and it also thins the uterine lining to prevent implantation of a fertilized egg. The hormone can weaken or kill the sperm that get into the uterus. It can stay in place for 3-5 years, depending on which type of IUD is used. PERMANENT METHODS OF CONTRACEPTION  Female tubal ligation. This is when the woman's fallopian tubes are surgically sealed, tied, or blocked to prevent the egg from traveling to the uterus.  Hysteroscopic sterilization. This involves placing a small coil or insert into each fallopian tube. Your doctor uses a technique called hysteroscopy to do the procedure. The device causes scar tissue to form. This results in permanent blockage of the fallopian tubes, so the sperm cannot fertilize the egg. It takes about 3 months after the procedure for the tubes to become blocked. You must use another form of birth control for these 3 months.  Female sterilization. This is when the female has the tubes that carry sperm tied off (vasectomy).This blocks sperm from entering the vagina during sexual intercourse. After the procedure, the man can still ejaculate fluid (semen). NATURAL PLANNING METHODS  Natural family planning. This is not having sexual intercourse or using a barrier method (condom, diaphragm, cervical cap) on days the woman could become pregnant.  Calendar method. This is keeping track of the length of each menstrual cycle and identifying when you are fertile.  Ovulation method. This is avoiding sexual intercourse during ovulation.  Symptothermal  method. This is avoiding sexual intercourse during ovulation, using a thermometer and ovulation symptoms.  Post-ovulation method. This is timing sexual intercourse after you have ovulated. Regardless of which type or method of contraception you choose, it is important that you use condoms to protect against the transmission of sexually transmitted infections (STIs). Talk with your health care provider about which form of contraception is most appropriate for you.   This information is not intended to replace advice given to you by your health care provider. Make sure you discuss any questions you have with your health care provider.   Document Released: 07/15/2005 Document Revised: 07/20/2013 Document Reviewed: 01/07/2013 Elsevier Interactive Patient Education 2016 [employer] Inc[employer]  

## 2015-10-03 NOTE — Progress Notes (Signed)
Reviewed with pt contraception. She is still undecided.

## 2015-10-03 NOTE — Progress Notes (Signed)
Subjective:  Jessica Riddle is a [age] y.o. G2P0 at 7268w6d being seen today for ongoing prenatal care.  She is currently monitored for the following issues for this high-risk pregnancy and has Supervision of normal first pregnancy, antepartum and Insufficient prenatal care on her problem list.  Patient reports no complaints.  Contractions: Not present. Vag. Bleeding: None.  Movement: Present. Denies leaking of fluid.   The following portions of the patient's history were reviewed and updated as appropriate: allergies, current medications, past family history, past medical history, past social history, past surgical history and problem list. Problem list updated.  Objective:   Filed Vitals:   10/03/15 1435  BP: 100/67  Pulse: 108  Weight: 180 lb (81.647 kg)    Fetal Status: Fetal Heart Rate (bpm): 150   Movement: Present     General:  Alert, oriented and cooperative. Patient is in no acute distress.  Skin: Skin is warm and dry. No rash noted.   Cardiovascular: Normal heart rate noted  Respiratory: Normal respiratory effort, no problems with respiration noted  Abdomen: Soft, gravid, appropriate for gestational age. Pain/Pressure: Present     Pelvic: Vag. Bleeding: None     Cervical exam deferred        Extremities: Normal range of motion.  Edema: Trace  Mental Status: Normal mood and affect. Normal behavior. Normal judgment and thought content.   Urinalysis: Urine Protein: Negative Urine Glucose: Negative  Assessment and Plan:  Pregnancy: G2P0 at 1568w6d  1. Supervision of normal first pregnancy, antepartum, third trimester  2. Insufficient prenatal care, third trimester  Pt was worried about weight loss.  She feel the same.  Explained to her the change in the scale.  Her FH is also 34.  Not a problem.  Preterm labor symptoms and general obstetric precautions including but not limited to vaginal bleeding, contractions, leaking of fluid and fetal movement were reviewed in detail with the  patient. Please refer to After Visit Summary for other counseling recommendations.  Return in about 2 weeks (around 10/17/2015).   Willodean Rosenthalarolyn Harraway-Smith, MD

## 2015-10-17 ENCOUNTER — Ambulatory Visit (INDEPENDENT_AMBULATORY_CARE_PROVIDER_SITE_OTHER): Payer: Self-pay | Admitting: Obstetrics & Gynecology

## 2015-10-17 VITALS — BP 117/79 | HR 118 | Wt 179.0 lb

## 2015-10-17 DIAGNOSIS — O0933 Supervision of pregnancy with insufficient antenatal care, third trimester: Secondary | ICD-10-CM

## 2015-10-17 DIAGNOSIS — Z3403 Encounter for supervision of normal first pregnancy, third trimester: Secondary | ICD-10-CM

## 2015-10-17 NOTE — Progress Notes (Signed)
Subjective:  Jessica Riddle is a 20 y.o. SH G2P0 (girl)  at 4227w6d being seen today for ongoing prenatal care.  She is currently monitored for the following issues for this low-risk pregnancy and has Supervision of normal first pregnancy, antepartum and Insufficient prenatal care on her problem list.  Patient reports no complaints.  Contractions: Not present. Vag. Bleeding: None.  Movement: Present. Denies leaking of fluid.   The following portions of the patient's history were reviewed and updated as appropriate: allergies, current medications, past family history, past medical history, past social history, past surgical history and problem list. Problem list updated.  Objective:   Filed Vitals:   10/17/15 1442  BP: 117/79  Pulse: 118  Weight: 179 lb (81.194 kg)    Fetal Status: Fetal Heart Rate (bpm): 143   Movement: Present     General:  Alert, oriented and cooperative. Patient is in no acute distress.  Skin: Skin is warm and dry. No rash noted.   Cardiovascular: Normal heart rate noted  Respiratory: Normal respiratory effort, no problems with respiration noted  Abdomen: Soft, gravid, appropriate for gestational age. Pain/Pressure: Absent     Pelvic: Vag. Bleeding: None Vag D/C Character: Thin   Cervical exam deferred        Extremities: Normal range of motion.  Edema: None  Mental Status: Normal mood and affect. Normal behavior. Normal judgment and thought content.   Urinalysis: Urine Protein: Trace Urine Glucose: Negative  Assessment and Plan:  Pregnancy: G2P0 at 6027w6d  1. Insufficient prenatal care, third trimester   2. Supervision of normal first pregnancy, antepartum, third trimester - cervical cultures at next visit  Preterm labor symptoms and general obstetric precautions including but not limited to vaginal bleeding, contractions, leaking of fluid and fetal movement were reviewed in detail with the patient. Please refer to After Visit Summary for other counseling  recommendations.  Return in about 1 week (around 10/24/2015).   Allie BossierMyra C Evva Din, MD

## 2015-10-25 ENCOUNTER — Ambulatory Visit (INDEPENDENT_AMBULATORY_CARE_PROVIDER_SITE_OTHER): Payer: Self-pay | Admitting: Certified Nurse Midwife

## 2015-10-25 VITALS — BP 104/65 | HR 85 | Wt 182.0 lb

## 2015-10-25 DIAGNOSIS — Z113 Encounter for screening for infections with a predominantly sexual mode of transmission: Secondary | ICD-10-CM

## 2015-10-25 DIAGNOSIS — Z3403 Encounter for supervision of normal first pregnancy, third trimester: Secondary | ICD-10-CM

## 2015-10-25 DIAGNOSIS — Z36 Encounter for antenatal screening of mother: Secondary | ICD-10-CM

## 2015-10-25 DIAGNOSIS — O0933 Supervision of pregnancy with insufficient antenatal care, third trimester: Secondary | ICD-10-CM

## 2015-10-25 LAB — OB RESULTS CONSOLE GBS: STREP GROUP B AG: NEGATIVE

## 2015-10-25 LAB — OB RESULTS CONSOLE GC/CHLAMYDIA: GC PROBE AMP, GENITAL: NEGATIVE

## 2015-10-25 NOTE — Progress Notes (Signed)
Subjective:  Jessica Riddle is a 20 y.o. G2P0 at 3368w0d being seen today for ongoing prenatal care.  She is currently monitored for the following issues for this low-risk pregnancy and has Supervision of normal first pregnancy, antepartum and Insufficient prenatal care on her problem list.  Patient reports no complaints.  Contractions: Not present. Vag. Bleeding: None.  Movement: Present. Denies leaking of fluid.   The following portions of the patient's history were reviewed and updated as appropriate: allergies, current medications, past family history, past medical history, past social history, past surgical history and problem list. Problem list updated.  Objective:   Filed Vitals:   10/25/15 1438  BP: 104/65  Pulse: 85  Weight: 182 lb (82.555 kg)    Fetal Status: Fetal Heart Rate (bpm): 138   Movement: Present     General:  Alert, oriented and cooperative. Patient is in no acute distress.  Skin: Skin is warm and dry. No rash noted.   Cardiovascular: Normal heart rate noted  Respiratory: Normal respiratory effort, no problems with respiration noted  Abdomen: Soft, gravid, appropriate for gestational age. Pain/Pressure: Absent     Pelvic: Vag. Bleeding: None Vag D/C Character: Thin   Cervical exam performed        Extremities: Normal range of motion.  Edema: Trace  Mental Status: Normal mood and affect. Normal behavior. Normal judgment and thought content.   Urinalysis: Urine Protein: Negative Urine Glucose: Negative  Assessment and Plan:  Pregnancy: G2P0 at 3268w0d  1. Supervision of normal first pregnancy, antepartum, third trimester GBS collected GC and ch collected  2. Insufficient prenatal care, third trimester   Preterm labor symptoms and general obstetric precautions including but not limited to vaginal bleeding, contractions, leaking of fluid and fetal movement were reviewed in detail with the patient. Please refer to After Visit Summary for other counseling  recommendations.  Return in about 1 week (around 11/01/2015).   Rhea PinkLori A Maleka Contino, CNM

## 2015-10-25 NOTE — Patient Instructions (Signed)

## 2015-10-26 LAB — GC/CHLAMYDIA PROBE AMP (~~LOC~~) NOT AT ARMC
Chlamydia: NEGATIVE
Neisseria Gonorrhea: NEGATIVE

## 2015-10-27 LAB — CULTURE, BETA STREP (GROUP B ONLY)

## 2015-11-01 ENCOUNTER — Ambulatory Visit (INDEPENDENT_AMBULATORY_CARE_PROVIDER_SITE_OTHER): Payer: Self-pay | Admitting: Certified Nurse Midwife

## 2015-11-01 VITALS — BP 105/68 | HR 87 | Wt 182.0 lb

## 2015-11-01 DIAGNOSIS — Z3483 Encounter for supervision of other normal pregnancy, third trimester: Secondary | ICD-10-CM

## 2015-11-01 DIAGNOSIS — Z3403 Encounter for supervision of normal first pregnancy, third trimester: Secondary | ICD-10-CM

## 2015-11-01 NOTE — Patient Instructions (Signed)

## 2015-11-01 NOTE — Progress Notes (Signed)
Subjective:  Jessica Riddle is a 20 y.o. G2P0 at 2375w0d being seen today for ongoing prenatal care.  She is currently monitored for the following issues for this low-risk pregnancy and has Supervision of normal first pregnancy, antepartum and Insufficient prenatal care on her problem list.  Patient reports no complaints.  Contractions: Not present. Vag. Bleeding: None.  Movement: Present. Denies leaking of fluid.   The following portions of the patient's history were reviewed and updated as appropriate: allergies, current medications, past family history, past medical history, past social history, past surgical history and problem list. Problem list updated.  Objective:   Filed Vitals:   11/01/15 1410  BP: 105/68  Pulse: 87  Weight: 182 lb (82.555 kg)    Fetal Status: Fetal Heart Rate (bpm): 144   Movement: Present     General:  Alert, oriented and cooperative. Patient is in no acute distress.  Skin: Skin is warm and dry. No rash noted.   Cardiovascular: Normal heart rate noted  Respiratory: Normal respiratory effort, no problems with respiration noted  Abdomen: Soft, gravid, appropriate for gestational age. Pain/Pressure: Absent     Pelvic: Vag. Bleeding: None Vag D/C Character: Thin   Cervical exam performed        Extremities: Normal range of motion.  Edema: Trace  Mental Status: Normal mood and affect. Normal behavior. Normal judgment and thought content.   Urinalysis: Urine Protein: Negative Urine Glucose: Negative  Assessment and Plan:  Pregnancy: G2P0 at 9275w0d  1. Supervision of normal first pregnancy, antepartum, third trimester   Term labor symptoms and general obstetric precautions including but not limited to vaginal bleeding, contractions, leaking of fluid and fetal movement were reviewed in detail with the patient. Please refer to After Visit Summary for other counseling recommendations.  Return in about 1 week (around 11/08/2015).   Rhea PinkLori A Clemmons, CNM

## 2015-11-06 ENCOUNTER — Ambulatory Visit (INDEPENDENT_AMBULATORY_CARE_PROVIDER_SITE_OTHER): Payer: Self-pay | Admitting: Family Medicine

## 2015-11-06 VITALS — BP 113/74 | HR 109 | Wt 184.0 lb

## 2015-11-06 DIAGNOSIS — Z3403 Encounter for supervision of normal first pregnancy, third trimester: Secondary | ICD-10-CM

## 2015-11-06 NOTE — Patient Instructions (Signed)
Third Trimester of Pregnancy The third trimester is from week 29 through week 42, months 7 through 9. The third trimester is a time when the fetus is growing rapidly. At the end of the ninth month, the fetus is about 20 inches in length and weighs 6-10 pounds.  BODY CHANGES Your body goes through many changes during pregnancy. The changes vary from woman to woman.   Your weight will continue to increase. You can expect to gain 25-35 pounds (11-16 kg) by the end of the pregnancy.  You may begin to get stretch marks on your hips, abdomen, and breasts.  You may urinate more often because the fetus is moving lower into your pelvis and pressing on your bladder.  You may develop or continue to have heartburn as a result of your pregnancy.  You may develop constipation because certain hormones are causing the muscles that push waste through your intestines to slow down.  You may develop hemorrhoids or swollen, bulging veins (varicose veins).  You may have pelvic pain because of the weight gain and pregnancy hormones relaxing your joints between the bones in your pelvis. Backaches may result from overexertion of the muscles supporting your posture.  You may have changes in your hair. These can include thickening of your hair, rapid growth, and changes in texture. Some women also have hair loss during or after pregnancy, or hair that feels dry or thin. Your hair will most likely return to normal after your baby is born.  Your breasts will continue to grow and be tender. A yellow discharge may leak from your breasts called colostrum.  Your belly button may stick out.  You may feel short of breath because of your expanding uterus.  You may notice the fetus "dropping," or moving lower in your abdomen.  You may have a bloody mucus discharge. This usually occurs a few days to a week before labor begins.  Your cervix becomes thin and soft (effaced) near your due date. WHAT TO EXPECT AT YOUR  PRENATAL EXAMS  You will have prenatal exams every 2 weeks until week 36. Then, you will have weekly prenatal exams. During a routine prenatal visit:  You will be weighed to make sure you and the fetus are growing normally.  Your blood pressure is taken.  Your abdomen will be measured to track your baby's growth.  The fetal heartbeat will be listened to.  Any test results from the previous visit will be discussed.  You may have a cervical check near your due date to see if you have effaced. At around 36 weeks, your caregiver will check your cervix. At the same time, your caregiver will also perform a test on the secretions of the vaginal tissue. This test is to determine if a type of bacteria, Group B streptococcus, is present. Your caregiver will explain this further. Your caregiver may ask you:  What your birth plan is.  How you are feeling.  If you are feeling the baby move.  If you have had any abnormal symptoms, such as leaking fluid, bleeding, severe headaches, or abdominal cramping.  If you are using any tobacco products, including cigarettes, chewing tobacco, and electronic cigarettes.  If you have any questions. Other tests or screenings that may be performed during your third trimester include:  Blood tests that check for low iron levels (anemia).  Fetal testing to check the health, activity level, and growth of the fetus. Testing is done if you have certain medical conditions or if   there are problems during the pregnancy.  HIV (human immunodeficiency virus) testing. If you are at high risk, you may be screened for HIV during your third trimester of pregnancy. FALSE LABOR You may feel small, irregular contractions that eventually go away. These are called Braxton Hicks contractions, or false labor. Contractions may last for hours, days, or even weeks before true labor sets in. If contractions come at regular intervals, intensify, or become painful, it is best to be seen  by your caregiver.  SIGNS OF LABOR   Menstrual-like cramps.  Contractions that are 5 minutes apart or less.  Contractions that start on the top of the uterus and spread down to the lower abdomen and back.  A sense of increased pelvic pressure or back pain.  A watery or bloody mucus discharge that comes from the vagina. If you have any of these signs before the 37th week of pregnancy, call your caregiver right away. You need to go to the hospital to get checked immediately. HOME CARE INSTRUCTIONS   Avoid all smoking, herbs, alcohol, and unprescribed drugs. These chemicals affect the formation and growth of the baby.  Do not use any tobacco products, including cigarettes, chewing tobacco, and electronic cigarettes. If you need help quitting, ask your health care provider. You may receive counseling support and other resources to help you quit.  Follow your caregiver's instructions regarding medicine use. There are medicines that are either safe or unsafe to take during pregnancy.  Exercise only as directed by your caregiver. Experiencing uterine cramps is a good sign to stop exercising.  Continue to eat regular, healthy meals.  Wear a good support bra for breast tenderness.  Do not use hot tubs, steam rooms, or saunas.  Wear your seat belt at all times when driving.  Avoid raw meat, uncooked cheese, cat litter boxes, and soil used by cats. These carry germs that can cause birth defects in the baby.  Take your prenatal vitamins.  Take 1500-2000 mg of calcium daily starting at the 20th week of pregnancy until you deliver your baby.  Try taking a stool softener (if your caregiver approves) if you develop constipation. Eat more high-fiber foods, such as fresh vegetables or fruit and whole grains. Drink plenty of fluids to keep your urine clear or pale yellow.  Take warm sitz baths to soothe any pain or discomfort caused by hemorrhoids. Use hemorrhoid cream if your caregiver  approves.  If you develop varicose veins, wear support hose. Elevate your feet for 15 minutes, 3-4 times a day. Limit salt in your diet.  Avoid heavy lifting, wear low heal shoes, and practice good posture.  Rest a lot with your legs elevated if you have leg cramps or low back pain.  Visit your dentist if you have not gone during your pregnancy. Use a soft toothbrush to brush your teeth and be gentle when you floss.  A sexual relationship may be continued unless your caregiver directs you otherwise.  Do not travel far distances unless it is absolutely necessary and only with the approval of your caregiver.  Take prenatal classes to understand, practice, and ask questions about the labor and delivery.  Make a trial run to the hospital.  Pack your hospital bag.  Prepare the baby's nursery.  Continue to go to all your prenatal visits as directed by your caregiver. SEEK MEDICAL CARE IF:  You are unsure if you are in labor or if your water has broken.  You have dizziness.  You have   mild pelvic cramps, pelvic pressure, or nagging pain in your abdominal area.  You have persistent nausea, vomiting, or diarrhea.  You have a bad smelling vaginal discharge.  You have pain with urination. SEEK IMMEDIATE MEDICAL CARE IF:   You have a fever.  You are leaking fluid from your vagina.  You have spotting or bleeding from your vagina.  You have severe abdominal cramping or pain.  You have rapid weight loss or gain.  You have shortness of breath with chest pain.  You notice sudden or extreme swelling of your face, hands, ankles, feet, or legs.  You have not felt your baby move in over an hour.  You have severe headaches that do not go away with medicine.  You have vision changes.   This information is not intended to replace advice given to you by your health care provider. Make sure you discuss any questions you have with your health care provider.   Document Released:  07/09/2001 Document Revised: 08/05/2014 Document Reviewed: 09/15/2012 Elsevier Interactive Patient Education 2016 [employer]  Breastfeeding Deciding to breastfeed is one of the best choices you can make for you and your baby. A change in hormones during pregnancy causes your breast tissue to grow and increases the number and size of your milk ducts. These hormones also allow proteins, sugars, and fats from your blood supply to make breast milk in your milk-producing glands. Hormones prevent breast milk from being released before your baby is born as well as prompt milk flow after birth. Once breastfeeding has begun, thoughts of your baby, as well as his or her sucking or crying, can stimulate the release of milk from your milk-producing glands.  BENEFITS OF BREASTFEEDING For Your Baby  Your first milk (colostrum) helps your baby's digestive system function better.  There are antibodies in your milk that help your baby fight off infections.  Your baby has a lower incidence of asthma, allergies, and sudden infant death syndrome.  The nutrients in breast milk are better for your baby than infant formulas and are designed uniquely for your baby's needs.  Breast milk improves your baby's brain development.  Your baby is less likely to develop other conditions, such as childhood obesity, asthma, or type 2 diabetes mellitus. For You  Breastfeeding helps to create a very special bond between you and your baby.  Breastfeeding is convenient. Breast milk is always available at the correct temperature and costs nothing.  Breastfeeding helps to burn calories and helps you lose the weight gained during pregnancy.  Breastfeeding makes your uterus contract to its prepregnancy size faster and slows bleeding (lochia) after you give birth.   Breastfeeding helps to lower your risk of developing type 2 diabetes mellitus, osteoporosis, and breast or ovarian cancer later in life. SIGNS THAT YOUR BABY IS  HUNGRY Early Signs of Hunger  Increased alertness or activity.  Stretching.  Movement of the head from side to side.  Movement of the head and opening of the mouth when the corner of the mouth or cheek is stroked (rooting).  Increased sucking sounds, smacking lips, cooing, sighing, or squeaking.  Hand-to-mouth movements.  Increased sucking of fingers or hands. Late Signs of Hunger  Fussing.  Intermittent crying. Extreme Signs of Hunger Signs of extreme hunger will require calming and consoling before your baby will be able to breastfeed successfully. Do not wait for the following signs of extreme hunger to occur before you initiate breastfeeding:  Restlessness.  A loud, strong cry.  Screaming.   BREASTFEEDING BASICS Breastfeeding Initiation  Find a comfortable place to sit or lie down, with your neck and back well supported.  Place a pillow or rolled up blanket under your baby to bring him or her to the level of your breast (if you are seated). Nursing pillows are specially designed to help support your arms and your baby while you breastfeed.  Make sure that your baby's abdomen is facing your abdomen.  Gently massage your breast. With your fingertips, massage from your chest wall toward your nipple in a circular motion. This encourages milk flow. You may need to continue this action during the feeding if your milk flows slowly.  Support your breast with 4 fingers underneath and your thumb above your nipple. Make sure your fingers are well away from your nipple and your baby's mouth.  Stroke your baby's lips gently with your finger or nipple.  When your baby's mouth is open wide enough, quickly bring your baby to your breast, placing your entire nipple and as much of the colored area around your nipple (areola) as possible into your baby's mouth.  More areola should be visible above your baby's upper lip than below the lower lip.  Your baby's tongue should be between his  or her lower gum and your breast.  Ensure that your baby's mouth is correctly positioned around your nipple (latched). Your baby's lips should create a seal on your breast and be turned out (everted).  It is common for your baby to suck about 2-3 minutes in order to start the flow of breast milk. Latching Teaching your baby how to latch on to your breast properly is very important. An improper latch can cause nipple pain and decreased milk supply for you and poor weight gain in your baby. Also, if your baby is not latched onto your nipple properly, he or she may swallow some air during feeding. This can make your baby fussy. Burping your baby when you switch breasts during the feeding can help to get rid of the air. However, teaching your baby to latch on properly is still the best way to prevent fussiness from swallowing air while breastfeeding. Signs that your baby has successfully latched on to your nipple:  Silent tugging or silent sucking, without causing you pain.  Swallowing heard between every 3-4 sucks.  Muscle movement above and in front of his or her ears while sucking. Signs that your baby has not successfully latched on to nipple:  Sucking sounds or smacking sounds from your baby while breastfeeding.  Nipple pain. If you think your baby has not latched on correctly, slip your finger into the corner of your baby's mouth to break the suction and place it between your baby's gums. Attempt breastfeeding initiation again. Signs of Successful Breastfeeding Signs from your baby:  A gradual decrease in the number of sucks or complete cessation of sucking.  Falling asleep.  Relaxation of his or her body.  Retention of a small amount of milk in his or her mouth.  Letting go of your breast by himself or herself. Signs from you:  Breasts that have increased in firmness, weight, and size 1-3 hours after feeding.  Breasts that are softer immediately after  breastfeeding.  Increased milk volume, as well as a change in milk consistency and color by the fifth day of breastfeeding.  Nipples that are not sore, cracked, or bleeding. Signs That Your Baby is Getting Enough Milk  Wetting at least 3 diapers in a 24-hour period.   The urine should be clear and pale yellow by age 5 days.  At least 3 stools in a 24-hour period by age 5 days. The stool should be soft and yellow.  At least 3 stools in a 24-hour period by age 7 days. The stool should be seedy and yellow.  No loss of weight greater than 10% of birth weight during the first 3 days of age.  Average weight gain of 4-7 ounces (113-198 g) per week after age 4 days.  Consistent daily weight gain by age 5 days, without weight loss after the age of 2 weeks. After a feeding, your baby may spit up a small amount. This is common. BREASTFEEDING FREQUENCY AND DURATION Frequent feeding will help you make more milk and can prevent sore nipples and breast engorgement. Breastfeed when you feel the need to reduce the fullness of your breasts or when your baby shows signs of hunger. This is called "breastfeeding on demand." Avoid introducing a pacifier to your baby while you are working to establish breastfeeding (the first 4-6 weeks after your baby is born). After this time you may choose to use a pacifier. Research has shown that pacifier use during the first year of a baby's life decreases the risk of sudden infant death syndrome (SIDS). Allow your baby to feed on each breast as long as he or she wants. Breastfeed until your baby is finished feeding. When your baby unlatches or falls asleep while feeding from the first breast, offer the second breast. Because newborns are often sleepy in the first few weeks of life, you may need to awaken your baby to get him or her to feed. Breastfeeding times will vary from baby to baby. However, the following rules can serve as a guide to help you ensure that your baby is  properly fed:  Newborns (babies 4 weeks of age or younger) may breastfeed every 1-3 hours.  Newborns should not go longer than 3 hours during the day or 5 hours during the night without breastfeeding.  You should breastfeed your baby a minimum of 8 times in a 24-hour period until you begin to introduce solid foods to your baby at around 6 months of age. BREAST MILK PUMPING Pumping and storing breast milk allows you to ensure that your baby is exclusively fed your breast milk, even at times when you are unable to breastfeed. This is especially important if you are going back to work while you are still breastfeeding or when you are not able to be present during feedings. Your lactation consultant can give you guidelines on how long it is safe to store breast milk. A breast pump is a machine that allows you to pump milk from your breast into a sterile bottle. The pumped breast milk can then be stored in a refrigerator or freezer. Some breast pumps are operated by hand, while others use electricity. Ask your lactation consultant which type will work best for you. Breast pumps can be purchased, but some hospitals and breastfeeding support groups lease breast pumps on a monthly basis. A lactation consultant can teach you how to hand express breast milk, if you prefer not to use a pump. CARING FOR YOUR BREASTS WHILE YOU BREASTFEED Nipples can become dry, cracked, and sore while breastfeeding. The following recommendations can help keep your breasts moisturized and healthy:  Avoid using soap on your nipples.  Wear a supportive bra. Although not required, special nursing bras and tank tops are designed to allow access to your   breasts for breastfeeding without taking off your entire bra or top. Avoid wearing underwire-style bras or extremely tight bras.  Air dry your nipples for 3-4minutes after each feeding.  Use only cotton bra pads to absorb leaked breast milk. Leaking of breast milk between feedings  is normal.  Use lanolin on your nipples after breastfeeding. Lanolin helps to maintain your skin's normal moisture barrier. If you use pure lanolin, you do not need to wash it off before feeding your baby again. Pure lanolin is not toxic to your baby. You may also hand express a few drops of breast milk and gently massage that milk into your nipples and allow the milk to air dry. In the first few weeks after giving birth, some women experience extremely full breasts (engorgement). Engorgement can make your breasts feel heavy, warm, and tender to the touch. Engorgement peaks within 3-5 days after you give birth. The following recommendations can help ease engorgement:  Completely empty your breasts while breastfeeding or pumping. You may want to start by applying warm, moist heat (in the shower or with warm water-soaked hand towels) just before feeding or pumping. This increases circulation and helps the milk flow. If your baby does not completely empty your breasts while breastfeeding, pump any extra milk after he or she is finished.  Wear a snug bra (nursing or regular) or tank top for 1-2 days to signal your body to slightly decrease milk production.  Apply ice packs to your breasts, unless this is too uncomfortable for you.  Make sure that your baby is latched on and positioned properly while breastfeeding. If engorgement persists after 48 hours of following these recommendations, contact your health care provider or a lactation consultant. OVERALL HEALTH CARE RECOMMENDATIONS WHILE BREASTFEEDING  Eat healthy foods. Alternate between meals and snacks, eating 3 of each per day. Because what you eat affects your breast milk, some of the foods may make your baby more irritable than usual. Avoid eating these foods if you are sure that they are negatively affecting your baby.  Drink milk, fruit juice, and water to satisfy your thirst (about 10 glasses a day).  Rest often, relax, and continue to take  your prenatal vitamins to prevent fatigue, stress, and anemia.  Continue breast self-awareness checks.  Avoid chewing and smoking tobacco. Chemicals from cigarettes that pass into breast milk and exposure to secondhand smoke may harm your baby.  Avoid alcohol and drug use, including marijuana. Some medicines that may be harmful to your baby can pass through breast milk. It is important to ask your health care provider before taking any medicine, including all over-the-counter and prescription medicine as well as vitamin and herbal supplements. It is possible to become pregnant while breastfeeding. If birth control is desired, ask your health care provider about options that will be safe for your baby. SEEK MEDICAL CARE IF:  You feel like you want to stop breastfeeding or have become frustrated with breastfeeding.  You have painful breasts or nipples.  Your nipples are cracked or bleeding.  Your breasts are red, tender, or warm.  You have a swollen area on either breast.  You have a fever or chills.  You have nausea or vomiting.  You have drainage other than breast milk from your nipples.  Your breasts do not become full before feedings by the fifth day after you give birth.  You feel sad and depressed.  Your baby is too sleepy to eat well.  Your baby is having trouble sleeping.     Your baby is wetting less than 3 diapers in a 24-hour period.  Your baby has less than 3 stools in a 24-hour period.  Your baby's skin or the white part of his or her eyes becomes yellow.   Your baby is not gaining weight by 5 days of age. SEEK IMMEDIATE MEDICAL CARE IF:  Your baby is overly tired (lethargic) and does not want to wake up and feed.  Your baby develops an unexplained fever.   This information is not intended to replace advice given to you by your health care provider. Make sure you discuss any questions you have with your health care provider.   Document Released: 07/15/2005  Document Revised: 04/05/2015 Document Reviewed: 01/06/2013 Elsevier Interactive Patient Education 2016 [employer]  

## 2015-11-06 NOTE — Progress Notes (Signed)
Subjective:  Jessica Riddle is a 20 y.o. G2P0 at 5533w5d being seen today for ongoing prenatal care.  She is currently monitored for the following issues for this low-risk pregnancy and has Supervision of normal first pregnancy, antepartum and Insufficient prenatal care on her problem list.  Patient reports no complaints.  Contractions: Not present. Vag. Bleeding: None.  Movement: Present. Denies leaking of fluid.   The following portions of the patient's history were reviewed and updated as appropriate: allergies, current medications, past family history, past medical history, past social history, past surgical history and problem list. Problem list updated.  Objective:   Filed Vitals:   11/06/15 1635  BP: 113/74  Pulse: 109  Weight: 184 lb (83.462 kg)    Fetal Status: Fetal Heart Rate (bpm): 143 Fundal Height: 34 cm Movement: Present  Presentation: Vertex  General:  Alert, oriented and cooperative. Patient is in no acute distress.  Skin: Skin is warm and dry. No rash noted.   Cardiovascular: Normal heart rate noted  Respiratory: Normal respiratory effort, no problems with respiration noted  Abdomen: Soft, gravid, appropriate for gestational age. Pain/Pressure: Present     Pelvic: Vag. Bleeding: None Vag D/C Character: Thin   Cervical exam deferred Dilation: Fingertip Effacement (%): 50 Station: -3  Extremities: Normal range of motion.  Edema: None  Mental Status: Normal mood and affect. Normal behavior. Normal judgment and thought content.   Urinalysis: Urine Protein: Trace Urine Glucose: Negative  Assessment and Plan:  Pregnancy: G2P0 at 5933w5d  1. Supervision of normal first pregnancy, antepartum, third trimester Continue routine prenatal care.   Term labor symptoms and general obstetric precautions including but not limited to vaginal bleeding, contractions, leaking of fluid and fetal movement were reviewed in detail with the patient. Please refer to After Visit Summary for other  counseling recommendations.  Return in 1 week (on 11/13/2015).   Reva Boresanya S Orvetta Danielski, MD

## 2015-11-14 ENCOUNTER — Encounter: Payer: Self-pay | Admitting: Obstetrics and Gynecology

## 2015-11-14 ENCOUNTER — Ambulatory Visit (INDEPENDENT_AMBULATORY_CARE_PROVIDER_SITE_OTHER): Payer: Self-pay | Admitting: Obstetrics and Gynecology

## 2015-11-14 VITALS — BP 120/81 | HR 97 | Wt 184.0 lb

## 2015-11-14 DIAGNOSIS — O0933 Supervision of pregnancy with insufficient antenatal care, third trimester: Secondary | ICD-10-CM

## 2015-11-14 DIAGNOSIS — Z3403 Encounter for supervision of normal first pregnancy, third trimester: Secondary | ICD-10-CM

## 2015-11-14 NOTE — Progress Notes (Signed)
Subjective:  Jessica Riddle is a 20 y.o. G2P0 at 6369w6d being seen today for ongoing prenatal care.  She is currently monitored for the following issues for this low-risk pregnancy and has Supervision of normal first pregnancy, antepartum and Insufficient prenatal care on her problem list.  Patient reports no complaints.  Contractions: Not present. Vag. Bleeding: None.  Movement: Present. Denies leaking of fluid.   The following portions of the patient's history were reviewed and updated as appropriate: allergies, current medications, past family history, past medical history, past social history, past surgical history and problem list. Problem list updated.  Objective:   Filed Vitals:   11/14/15 1523  BP: 120/81  Pulse: 97  Weight: 184 lb (83.462 kg)    Fetal Status: Fetal Heart Rate (bpm): 130   Movement: Present     General:  Alert, oriented and cooperative. Patient is in no acute distress.  Skin: Skin is warm and dry. No rash noted.   Cardiovascular: Normal heart rate noted  Respiratory: Normal respiratory effort, no problems with respiration noted  Abdomen: Soft, gravid, appropriate for gestational age. Pain/Pressure: Present     Pelvic: Vag. Bleeding: None Vag D/C Character: Thin   Cervical exam deferred        Extremities: Normal range of motion.  Edema: None  Mental Status: Normal mood and affect. Normal behavior. Normal judgment and thought content.   Urinalysis:      Assessment and Plan:  Pregnancy: G2P0 at 5269w6d  1. Supervision of normal first pregnancy, antepartum, third trimester Patient is doing well Continue routine prenatal care Patient still needs to find a pediatrician  2. Insufficient prenatal care, third trimester   Term labor symptoms and general obstetric precautions including but not limited to vaginal bleeding, contractions, leaking of fluid and fetal movement were reviewed in detail with the patient. Please refer to After Visit Summary for other  counseling recommendations.  No Follow-up on file.   Catalina AntiguaPeggy Leesa Leifheit, MD

## 2015-11-22 ENCOUNTER — Telehealth: Payer: Self-pay | Admitting: *Deleted

## 2015-11-22 ENCOUNTER — Encounter: Payer: Self-pay | Admitting: Certified Nurse Midwife

## 2015-11-22 ENCOUNTER — Inpatient Hospital Stay (HOSPITAL_COMMUNITY)
Admission: AD | Admit: 2015-11-22 | Discharge: 2015-11-22 | Disposition: A | Discharge status: Home / Self Care | Payer: Self-pay | Source: Ambulatory Visit | Attending: Obstetrics and Gynecology | Admitting: Obstetrics and Gynecology

## 2015-11-22 ENCOUNTER — Encounter (HOSPITAL_COMMUNITY): Payer: Self-pay | Admitting: *Deleted

## 2015-11-22 DIAGNOSIS — O0933 Supervision of pregnancy with insufficient antenatal care, third trimester: Secondary | ICD-10-CM

## 2015-11-22 DIAGNOSIS — Z3403 Encounter for supervision of normal first pregnancy, third trimester: Secondary | ICD-10-CM

## 2015-11-22 DIAGNOSIS — Z3493 Encounter for supervision of normal pregnancy, unspecified, third trimester: Secondary | ICD-10-CM | POA: Insufficient documentation

## 2015-11-22 HISTORY — DX: Other specified health status: Z78.9

## 2015-11-22 NOTE — Discharge Instructions (Signed)
Parto natural °(Natural Childbirth) °Un parto natural es un parto en el que no se utilizan medicamentos para el dolor ni anestesia (epidural o espinal). Tampoco se utilizan monitores fetales, no se realiza cesárea (una incisión en la parte inferior del abdomen) ni episiotomía (un corte en la parte inferior de la vagina). Con la ayuda de una persona especializada en partos (partera), usted dirigirá su propio parto como desee. °Muchas mujeres eligen realizar un parto natural porque se sienten que pueden controlar mejor el proceso y estar en contacto con el parto y el nacimiento. También lo hacen por preocupación con respecto al efecto que puedan tener los medicamentos en ellas y el bebé. °Las mujeres con un embarazo de alto riesgo no deben intentar realizar un parto natural. Es mejor realizar el parto en un hospital por si aparece alguna situación de emergencia. El médico interviene por la salud y la seguridad de la madre y el bebé. °DOS TÉCNICAS PARA EL PARTO NATURAL:  °· El método Lamaze fomenta a las mujeres que tener un bebé es normal, saludable y natural. También se enseña a la madre a tener una postura neutral con respecto a los medicamentos para el dolor y la anestesia y tomar una decisión informada de si es lo correcto para ellas y del momento adecuado. °· El método Bradley (también llamado Parto Asistido por el Padre) fomenta al padre a ser el asistente en el parto y sostiene un enfoque natural. También fomenta el ejercicio y una dieta balanceada. Los ejercicios enseñan relajación y técnicas de respiración profunda. Sin embargo, existen clases para preparar a los padres para una situación de emergencia. °FORMAS DE CONTROLAR EL DOLOR EN EL PARTO: °· Meditación. °· Yoga. °· Hipnosis. °· Acupuntura. °· Masajes. °· Cambiar posiciones (caminar, mecerse, bañarse, recostarse sobre la pelota de dilatación). °· Recostarse en agua tibia o en un jacuzzi. °· Realice algún tipo de actividad que ayude a enfocarse en otra  cosa que no sea el parto. °· Escuchar música suave. °· Imágenes visuales (enfocarse en un objeto particular). °ANTES DE COMENZAR EL PARTO °· Asegúrese de que usted y su esposo/compañero están de acuerdo en llevar un parto natural. °· Decida si el profesional que lo asiste o una partera asistirá en el parto. °· Decida si tendrá el bebé en un hospital, en una sala de partos o en su hogar. °· Si tiene hijos, haga planes para que alguien los cuide cuando usted esté en el hospital. °· Conozca la distancia y el tiempo que toma llegar a la sala de parto. Vaya previamente para asegurarse. °· Tenga preparado un bolso con un camisón, una bata y artículos de tocador para llevar cuando comience el parto. °· Tenga los números telefónicos de sus amigos y familia a mano por si necesita llamar a alguien cuando comience el parto. °· Su esposo/compañero debe concurrir a todas las clases. °· Hable con el profesional acerca de la posibilidad de una emergencia médica y qué pasará si esto ocurre. °VENTAJAS DEL PARTO NATURAL °· Usted tiene el control del parto. °· Es seguro. °· No se utilizan medicamentos o anestesias que puedan afectar al bebé. °· No hay procedimientos invasivos como la episiotomía. °· Usted y su compañero trabajarán juntos y eso fortalecerá la relación. °· La meditación, el yoga, los masajes y los ejercicios de respiración pueden aprenderse mientras está embarazada y ayudarla cuando esté realizando el parto. °· En la mayor parte de las salas de parto, la familia y amigos pueden involucrarse en el parto y el   proceso de alumbramiento. DESVENTAJAS DEL PARTO NATURAL  Sentir dolor durante el parto.  Los mtodos (descritos anteriormente) para ayudar a aliviar el dolor pueden no funcionar en su caso particular.  Puede sentirse avergonzada y frustrada si decide cambiar de idea durante el parto y no tener un parto natural. DESPUS DEL PARTO  Estar muy cansada.  Sentir molestias debido a las contracciones del  tero. Sentir dolor alrededor de la vagina.  Es posible que sienta fro y temblores, es una reaccin natural.  Se sentir emocionada, abrumada, exitosa y orgullosa de ser madre. INSTRUCCIONES PARA EL CUIDADO DOMICILIARIO  Siga los consejos e indicaciones del profesional que la asiste.  Siga las indicaciones de su instructor de parto natural (mtodo Lamaze o Bradley).   Esta informacin no tiene como fin reemplazar el consejo del mdico. Asegrese de hacerle al mdico cualquier pregunta que tenga.   Document Released: 10/11/2008 Document Revised: 10/07/2011 Elsevier Interactive Patient Education 2016 Elsevier Inc.  

## 2015-11-22 NOTE — Telephone Encounter (Signed)
Pt called in stating she is having lower back and lower abd pain that comes and goes every 5 minutes. She denies leaking of fluid. Advised pt to report to [facility] for eval. Pt expressed understanding.

## 2015-11-23 ENCOUNTER — Inpatient Hospital Stay (HOSPITAL_COMMUNITY): Payer: Medicaid Other | Admitting: Anesthesiology

## 2015-11-23 ENCOUNTER — Inpatient Hospital Stay (HOSPITAL_COMMUNITY)
Admission: AD | Admit: 2015-11-23 | Discharge: 2015-11-23 | Disposition: A | Discharge status: Home / Self Care | Payer: Self-pay | Source: Ambulatory Visit | Attending: Obstetrics & Gynecology | Admitting: Obstetrics & Gynecology

## 2015-11-23 ENCOUNTER — Ambulatory Visit (INDEPENDENT_AMBULATORY_CARE_PROVIDER_SITE_OTHER): Payer: Self-pay | Admitting: Family Medicine

## 2015-11-23 ENCOUNTER — Encounter (HOSPITAL_COMMUNITY): Payer: Self-pay | Admitting: *Deleted

## 2015-11-23 ENCOUNTER — Inpatient Hospital Stay (HOSPITAL_COMMUNITY)
Admission: AD | Admit: 2015-11-23 | Discharge: 2015-11-25 | DRG: 775 | Disposition: A | Discharge status: Home / Self Care | Payer: Medicaid Other | Source: Ambulatory Visit | Attending: Obstetrics & Gynecology | Admitting: Obstetrics & Gynecology

## 2015-11-23 VITALS — Wt 182.0 lb

## 2015-11-23 DIAGNOSIS — O0930 Supervision of pregnancy with insufficient antenatal care, unspecified trimester: Secondary | ICD-10-CM

## 2015-11-23 DIAGNOSIS — Z833 Family history of diabetes mellitus: Secondary | ICD-10-CM | POA: Diagnosis not present

## 2015-11-23 DIAGNOSIS — Z3403 Encounter for supervision of normal first pregnancy, third trimester: Secondary | ICD-10-CM

## 2015-11-23 DIAGNOSIS — O0931 Supervision of pregnancy with insufficient antenatal care, first trimester: Secondary | ICD-10-CM

## 2015-11-23 DIAGNOSIS — Z3400 Encounter for supervision of normal first pregnancy, unspecified trimester: Secondary | ICD-10-CM

## 2015-11-23 DIAGNOSIS — Z3A40 40 weeks gestation of pregnancy: Secondary | ICD-10-CM

## 2015-11-23 DIAGNOSIS — O480 Post-term pregnancy: Secondary | ICD-10-CM

## 2015-11-23 DIAGNOSIS — Z3493 Encounter for supervision of normal pregnancy, unspecified, third trimester: Secondary | ICD-10-CM | POA: Insufficient documentation

## 2015-11-23 LAB — ABO/RH: ABO/RH(D): O POS

## 2015-11-23 LAB — CBC
HCT: 29.1 % — ABNORMAL LOW (ref 36.0–46.0)
Hemoglobin: 9.4 g/dL — ABNORMAL LOW (ref 12.0–15.0)
MCH: 24.5 pg — ABNORMAL LOW (ref 26.0–34.0)
MCHC: 32.3 g/dL (ref 30.0–36.0)
MCV: 76 fL — ABNORMAL LOW (ref 78.0–100.0)
Platelets: 235 10*3/uL (ref 150–400)
RBC: 3.83 MIL/uL — ABNORMAL LOW (ref 3.87–5.11)
RDW: 16.1 % — ABNORMAL HIGH (ref 11.5–15.5)
WBC: 13.8 10*3/uL — ABNORMAL HIGH (ref 4.0–10.5)

## 2015-11-23 LAB — TYPE AND SCREEN
ABO/RH(D): O POS
Antibody Screen: NEGATIVE

## 2015-11-23 MED ORDER — LACTATED RINGERS IV SOLN: 500.0000 mL | Freq: Once | INTRAVENOUS | Status: DC

## 2015-11-23 MED ORDER — ONDANSETRON HCL 4 MG PO TABS: 4.0000 mg | ORAL_TABLET | ORAL | Status: DC | PRN

## 2015-11-23 MED ORDER — DIBUCAINE 1 % RE OINT: 1.0000 "application " | TOPICAL_OINTMENT | RECTAL | Status: DC | PRN

## 2015-11-23 MED ORDER — LACTATED RINGERS IV SOLN
INTRAVENOUS | Status: DC
  Administered 2015-11-23: 11:00:00 via INTRAVENOUS
  Administered 2015-11-23: 125 mL/h via INTRAVENOUS

## 2015-11-23 MED ORDER — LACTATED RINGERS IV SOLN
500.0000 mL | Freq: Once | INTRAVENOUS | Status: AC
  Administered 2015-11-23: 500 mL via INTRAVENOUS

## 2015-11-23 MED ORDER — EPHEDRINE 5 MG/ML INJ
10.0000 mg | INTRAVENOUS | Status: DC | PRN
  Filled 2015-11-23: qty 2

## 2015-11-23 MED ORDER — ACETAMINOPHEN 325 MG PO TABS
650.0000 mg | ORAL_TABLET | ORAL | Status: DC | PRN
  Administered 2015-11-24: 650 mg via ORAL

## 2015-11-23 MED ORDER — LIDOCAINE HCL (PF) 1 % IJ SOLN
30.0000 mL | INTRAMUSCULAR | Status: DC | PRN
  Filled 2015-11-23: qty 30

## 2015-11-23 MED ORDER — COCONUT OIL OIL
1.0000 "application " | TOPICAL_OIL | Status: DC | PRN
  Filled 2015-11-23: qty 120

## 2015-11-23 MED ORDER — ZOLPIDEM TARTRATE 5 MG PO TABS: 5.0000 mg | ORAL_TABLET | Freq: Every evening | ORAL | Status: DC | PRN

## 2015-11-23 MED ORDER — PHENYLEPHRINE 40 MCG/ML (10ML) SYRINGE FOR IV PUSH (FOR BLOOD PRESSURE SUPPORT)
80.0000 ug | PREFILLED_SYRINGE | INTRAVENOUS | Status: DC | PRN
  Filled 2015-11-23: qty 5

## 2015-11-23 MED ORDER — OXYCODONE-ACETAMINOPHEN 5-325 MG PO TABS: 2.0000 | ORAL_TABLET | ORAL | Status: DC | PRN

## 2015-11-23 MED ORDER — CITRIC ACID-SODIUM CITRATE 334-500 MG/5ML PO SOLN: 30.0000 mL | ORAL | Status: DC | PRN

## 2015-11-23 MED ORDER — DIPHENHYDRAMINE HCL 50 MG/ML IJ SOLN: 12.5000 mg | INTRAMUSCULAR | Status: DC | PRN

## 2015-11-23 MED ORDER — OXYTOCIN 10 UNIT/ML IJ SOLN: 2.5000 [IU]/h | INTRAVENOUS | Status: DC

## 2015-11-23 MED ORDER — FLEET ENEMA 7-19 GM/118ML RE ENEM: 1.0000 | ENEMA | RECTAL | Status: DC | PRN

## 2015-11-23 MED ORDER — OXYCODONE-ACETAMINOPHEN 5-325 MG PO TABS
1.0000 | ORAL_TABLET | Freq: Once | ORAL | Status: AC
  Administered 2015-11-23: 2 via ORAL
  Filled 2015-11-23: qty 2

## 2015-11-23 MED ORDER — PRENATAL MULTIVITAMIN CH
1.0000 | ORAL_TABLET | Freq: Every day | ORAL | Status: DC
  Administered 2015-11-24 – 2015-11-25 (×2): 1 via ORAL
  Filled 2015-11-23 (×2): qty 1

## 2015-11-23 MED ORDER — IBUPROFEN 600 MG PO TABS
600.0000 mg | ORAL_TABLET | Freq: Four times a day (QID) | ORAL | Status: DC
  Administered 2015-11-23 – 2015-11-25 (×6): 600 mg via ORAL
  Filled 2015-11-23 (×6): qty 1

## 2015-11-23 MED ORDER — SENNOSIDES-DOCUSATE SODIUM 8.6-50 MG PO TABS
2.0000 | ORAL_TABLET | ORAL | Status: DC
  Administered 2015-11-23 – 2015-11-24 (×2): 2 via ORAL
  Filled 2015-11-23 (×2): qty 2

## 2015-11-23 MED ORDER — WITCH HAZEL-GLYCERIN EX PADS: 1.0000 "application " | MEDICATED_PAD | CUTANEOUS | Status: DC | PRN

## 2015-11-23 MED ORDER — ZOLPIDEM TARTRATE 5 MG PO TABS
5.0000 mg | ORAL_TABLET | Freq: Once | ORAL | Status: AC
  Administered 2015-11-23: 5 mg via ORAL
  Filled 2015-11-23: qty 1

## 2015-11-23 MED ORDER — LACTATED RINGERS IV SOLN: 500.0000 mL | INTRAVENOUS | Status: DC | PRN

## 2015-11-23 MED ORDER — ACETAMINOPHEN 325 MG PO TABS
650.0000 mg | ORAL_TABLET | ORAL | Status: DC | PRN
  Filled 2015-11-23: qty 2

## 2015-11-23 MED ORDER — PHENYLEPHRINE 40 MCG/ML (10ML) SYRINGE FOR IV PUSH (FOR BLOOD PRESSURE SUPPORT)
80.0000 ug | PREFILLED_SYRINGE | INTRAVENOUS | Status: DC | PRN
  Filled 2015-11-23: qty 5
  Filled 2015-11-23: qty 10

## 2015-11-23 MED ORDER — BENZOCAINE-MENTHOL 20-0.5 % EX AERO
1.0000 "application " | INHALATION_SPRAY | CUTANEOUS | Status: DC | PRN
  Administered 2015-11-24: 1 via TOPICAL
  Filled 2015-11-23: qty 56

## 2015-11-23 MED ORDER — OXYTOCIN BOLUS FROM INFUSION
500.0000 mL | INTRAVENOUS | Status: DC
  Administered 2015-11-23: 500 mL via INTRAVENOUS

## 2015-11-23 MED ORDER — DIPHENHYDRAMINE HCL 25 MG PO CAPS: 25.0000 mg | ORAL_CAPSULE | Freq: Four times a day (QID) | ORAL | Status: DC | PRN

## 2015-11-23 MED ORDER — SIMETHICONE 80 MG PO CHEW: 80.0000 mg | CHEWABLE_TABLET | ORAL | Status: DC | PRN

## 2015-11-23 MED ORDER — ONDANSETRON HCL 4 MG/2ML IJ SOLN: 4.0000 mg | Freq: Four times a day (QID) | INTRAMUSCULAR | Status: DC | PRN

## 2015-11-23 MED ORDER — OXYCODONE-ACETAMINOPHEN 5-325 MG PO TABS: 1.0000 | ORAL_TABLET | ORAL | Status: DC | PRN

## 2015-11-23 MED ORDER — LACTATED RINGERS IV SOLN
1.0000 m[IU]/min | INTRAVENOUS | Status: DC
  Administered 2015-11-23: 1 m[IU]/min via INTRAVENOUS
  Filled 2015-11-23: qty 4

## 2015-11-23 MED ORDER — TERBUTALINE SULFATE 1 MG/ML IJ SOLN
0.2500 mg | Freq: Once | INTRAMUSCULAR | Status: DC | PRN
  Filled 2015-11-23: qty 1

## 2015-11-23 MED ORDER — LIDOCAINE HCL (PF) 1 % IJ SOLN
INTRAMUSCULAR | Status: DC | PRN
  Administered 2015-11-23 (×2): 4 mL via EPIDURAL

## 2015-11-23 MED ORDER — FENTANYL 2.5 MCG/ML BUPIVACAINE 1/10 % EPIDURAL INFUSION (WH - ANES)
14.0000 mL/h | INTRAMUSCULAR | Status: DC | PRN
  Administered 2015-11-23 (×3): 14 mL/h via EPIDURAL
  Filled 2015-11-23 (×2): qty 125

## 2015-11-23 MED ORDER — ONDANSETRON HCL 4 MG/2ML IJ SOLN: 4.0000 mg | INTRAMUSCULAR | Status: DC | PRN

## 2015-11-23 MED ORDER — TETANUS-DIPHTH-ACELL PERTUSSIS 5-2.5-18.5 LF-MCG/0.5 IM SUSP: 0.5000 mL | Freq: Once | INTRAMUSCULAR | Status: DC

## 2015-11-23 NOTE — Anesthesia Postprocedure Evaluation (Signed)
Anesthesia Post Note  Patient: Jessica Riddle  Procedure(s) Performed: * No procedures listed *  Patient location during evaluation: Mother Baby Anesthesia Type: Epidural Level of consciousness: awake and alert Pain management: satisfactory to patient Vital Signs Assessment: post-procedure vital signs reviewed and stable Respiratory status: respiratory function stable Cardiovascular status: stable Postop Assessment: no headache, no backache, epidural receding, patient able to bend at knees, no signs of nausea or vomiting and adequate PO intake Anesthetic complications: no Comments: Comfort level was assessed by AnesthesiaTeam and the patient was pleased with the care, interventions, and services provided by the Department of Anesthesia.     Last Vitals:  Filed Vitals:   11/23/15 2116 11/23/15 2137  BP: 121/73 116/57  Pulse: 100 101  Temp:    Resp: 18 18    Last Pain:  Filed Vitals:   11/23/15 2216  PainSc: 0-No pain   Pain Goal: Patients Stated Pain Goal: 2 (11/23/15 1247)               Karleen DolphinFUSSELL,Abdalla Naramore

## 2015-11-23 NOTE — MAU Note (Signed)
Pt reports contractions x 24 hours. Reports brownish discharge on tissue.

## 2015-11-23 NOTE — Progress Notes (Signed)
Jessica Riddle is a 20 y.o. G1P0000 at 4438w1d by  admitted for active labor  Subjective: Pt is comfortable; not feeling any pressure in vagina to push. Pt is exaggerated lateal sims position  Objective: BP 137/66 mmHg  Pulse 94  Temp(Src) 98.7 F (37.1 C) (Oral)  Resp 18  Ht 5\' 4"  (1.626 m)  Wt 182 lb (82.555 kg)  BMI 31.22 kg/m2  SpO2 99%  LMP 02/15/2015   Total I/O In: -  Out: 850 [Urine:850]  FHT:  FHR: 135 bpm, variability: moderate,  accelerations:  Present,  decelerations:  Absent UC:   regular, every 2 minutes SVE:   Dilation: 10 Effacement (%): 90 Station: -1 Exam by:: l. Jessica Riddle, cnm  OT position  Labs: Lab Results  Component Value Date   WBC 13.8* 11/23/2015   HGB 9.4* 11/23/2015   HCT 29.1* 11/23/2015   MCV 76.0* 11/23/2015   PLT 235 11/23/2015    Assessment / Plan: Spontaneous labor, progressing normally  Labor: Progressing normally Preeclampsia:   Fetal Wellbeing:  Category I Pain Control:  Labor support without medications I/D:   Anticipated MOD:  NSVD  Jessica Riddle 11/23/2015, 6:34 PM

## 2015-11-23 NOTE — Progress Notes (Signed)
Jessica Riddle is a 20 y.o. G1P0000 at 7541w1d by  admitted for active labor  Subjective: Comfortable with epidural;   Objective: BP 100/53 mmHg  Pulse 84  Temp(Src) 98.6 F (37 C) (Oral)  Resp 18  Ht 5\' 4"  (1.626 m)  Wt 182 lb (82.555 kg)  BMI 31.22 kg/m2  SpO2 100%  LMP 02/15/2015      FHT:  Category 1  UC:   Unable to trace well SVE:    5-6/90/-2  AROM meconium stained fluid IUPC placed without difficulty  Labs: Lab Results  Component Value Date   WBC 13.8* 11/23/2015   HGB 9.4* 11/23/2015   HCT 29.1* 11/23/2015   MCV 76.0* 11/23/2015   PLT 235 11/23/2015    Assessment / Plan: Spontaneous labor, progressing normally  Labor: Progressing normally Preeclampsia:   Fetal Wellbeing:  Cat 1 Pain Control:  Epidural I/D:   Anticipated MOD:  NSVD  Clemmons,Lori Grissett 11/23/2015, 1:11 PM

## 2015-11-23 NOTE — H&P (Signed)
Jessica GougeLucia Riddle is a 20 y.o. female presenting for SOL. [facility] Dr. Shawnie PonsPratt had checked her cervix, 4-5 cm with BBOW. Questionable leaking. Pt will be admitted for labor. Unremarkable pregnancy other than late to care @ 26 weeks.    [facility] Prenatal Labs  Dating lmp Blood type: O/POS/-- (01/18 1008)   Genetic Screen Too late Antibody:NEG (01/18 1008)  Anatomic US Normal Rubella: 7.73 (01/18 1008)  GTT  Third trimester: 93 RPR: NON REAC (02/02 0921)   Flu vaccine decline HBsAg: NEGATIVE (01/18 1008)   TDaP vaccine              08/31/2015                                  HIV: NONREACTIVE (02/02 0921)   Baby Food            Breast/bottle                                GBS: Negative  Contraception Nexplanon Pap: n/a  Circumcision n/a   Pediatrician Undecided   Support Person Mother and sister [name]       Maternal Medical History:  Reason for admission: Contractions.   Contractions: Frequency: regular.   Perceived severity is strong.    Fetal activity: Perceived fetal activity is normal.    Prenatal Complications - Diabetes: none.    OB History    Gravida Para Term Preterm AB TAB SAB Ectopic Multiple Living   1 0 0 0 0 0 0 0 0 0      Past Medical History  Diagnosis Date  . Medical history non-contributory    Past Surgical History  Procedure Laterality Date  . No past surgeries     Family History: family history includes Diabetes in her maternal grandmother. Social History:  reports that she has never smoked. She has never used smokeless tobacco. She reports that she does not drink alcohol or use illicit drugs.   Prenatal Transfer Tool  Maternal Diabetes: No Genetic Screening: too late Maternal Ultrasounds/Referrals: Normal Fetal Ultrasounds or other Referrals:  None Maternal Substance Abuse:  No Significant Maternal Medications:  None Significant Maternal Lab Results:  None Other Comments:  None  Review of Systems  Constitutional: Negative for  fever.  Gastrointestinal: Positive for abdominal pain.  All other systems reviewed and are negative.     Last menstrual period 02/15/2015. Maternal Exam:  Uterine Assessment: Contraction strength is firm.  Contraction frequency is regular.   Abdomen: Patient reports no abdominal tenderness. Fetal presentation: vertex  Introitus: Normal vulva. Normal vagina.  Pelvis: adequate for delivery.   Cervix: Cervix evaluated by digital exam.     Fetal Exam Fetal Monitor Review: Mode: ultrasound.   Variability: moderate (6-25 bpm).   Pattern: accelerations present and early decelerations.       Physical Exam  Nursing note and vitals reviewed. Constitutional: She is oriented to person, place, and time. She appears well-developed and well-nourished. No distress.  HENT:  Head: Normocephalic and atraumatic.  Neck: Normal range of motion.  Cardiovascular: Normal rate.   Respiratory: Effort normal. No respiratory distress.  GI: Soft. There is no tenderness.  Genitourinary: Vagina normal.  Musculoskeletal: Normal range of motion.  Neurological: She is alert and oriented to person, place, and time.  Skin: Skin is warm and dry.  Psychiatric:  She has a normal mood and affect. Her behavior is normal. Judgment and thought content normal.    Prenatal labs: ABO, Rh: O/POS/-- (01/18 1008) Antibody: NEG (01/18 1008) Rubella: 7.73 (01/18 1008) RPR: NON REAC (02/02 0921)  HBsAg: NEGATIVE (01/18 1008)  HIV: NONREACTIVE (02/02 0921)  GBS:     Assessment/Plan: Admit Epidural Anticipate Vaginal Delivery   Clemmons,Lori Grissett 11/23/2015, 11:25 AM

## 2015-11-23 NOTE — Anesthesia Post-op Follow-up Note (Cosign Needed)
  Anesthesia Pain Follow-up Note  Patient: Jessica GougeLucia Riddle  Day #: 1  Date of Follow-up: 11/23/2015 Time: 10:19 PM  Last Vitals:  Filed Vitals:   11/23/15 2116 11/23/15 2137  BP: 121/73 116/57  Pulse: 100 101  Temp:    Resp: 18 18    Level of Consciousness: alert  Pain: none   Side Effects:None  Catheter Site Exam:healing  Plan: D/C from anesthesia care  Corlis Angelica

## 2015-11-23 NOTE — Discharge Instructions (Signed)

## 2015-11-23 NOTE — Progress Notes (Signed)
Pt states she saw brown colored vaginal discharge this morning when she used the bathroom

## 2015-11-23 NOTE — Progress Notes (Signed)
Jessica Riddle is a 20 y.o. G1P0000 at 5175w1d by admitted for active labor  Subjective: Comfortable with epidural.   Objective: BP 137/66 mmHg  Pulse 94  Temp(Src) 98.7 F (37.1 C) (Oral)  Resp 18  Ht 5\' 4"  (1.626 m)  Wt 182 lb (82.555 kg)  BMI 31.22 kg/m2  SpO2 99%  LMP 02/15/2015   Total I/O In: -  Out: 850 [Urine:850]  FHT:  Early decelerations with contractiosn UC:   Inadequate labor pattern SVE:  6-7/90/-2 FSE placed Labs: Lab Results  Component Value Date   WBC 13.8* 11/23/2015   HGB 9.4* 11/23/2015   HCT 29.1* 11/23/2015   MCV 76.0* 11/23/2015   PLT 235 11/23/2015    Assessment / Plan: Augmentation of labor, progressing well  Labor: Progressing normally Preeclampsia:   Fetal Wellbeing:  Category II Pain Control:  Epidural I/D:   Anticipated MOD:  NSVD  Clemmons,Lori Grissett 11/23/2015, 6:29 PM

## 2015-11-23 NOTE — Progress Notes (Signed)
Subjective:  Jessica Riddle is a 20 y.o. G1P0000 at 3066w1d being seen today for ongoing prenatal care.  She is currently monitored for the following issues for this low-risk pregnancy and has Supervision of normal first pregnancy, antepartum and Insufficient prenatal care on her problem list.  Patient reports no complaints.  Contractions: Irregular. Vag. Bleeding: None.  Movement: Present. Denies leaking of fluid.   The following portions of the patient's history were reviewed and updated as appropriate: allergies, current medications, past family history, past medical history, past social history, past surgical history and problem list. Problem list updated.  Objective:   Filed Vitals:   11/23/15 0837  Weight: 182 lb (82.555 kg)    Fetal Status: Fetal Heart Rate (bpm): NST   Movement: Present  Presentation: Vertex  General:  Alert, oriented and cooperative. Patient is in no acute distress.  Skin: Skin is warm and dry. No rash noted.   Cardiovascular: Normal heart rate noted  Respiratory: Normal respiratory effort, no problems with respiration noted  Abdomen: Soft, gravid, appropriate for gestational age. Pain/Pressure: Present     Pelvic: Vag. Bleeding: None Vag D/C Character: Other (Comment)   Cervical exam performed Dilation: 4.5 Effacement (%): 90 Station: -1 intact Bag noted, + nitrazine, no fern  Extremities: Normal range of motion.  Edema: None  Mental Status: Normal mood and affect. Normal behavior. Normal judgment and thought content.   NST reviewed and non-reactive with variables. AFI is 7 cm, vtx  Assessment and Plan:  Pregnancy: G1P0000 at 8166w1d  1. Supervision of normal first pregnancy, antepartum, third trimester Given GA, fetal tracing and cervical dilation--will move to delivery[facility]  Term labor symptoms and general obstetric precautions including but not limited to vaginal bleeding, contractions, leaking of fluid and fetal  movement were reviewed in detail with the patient. Please refer to After Visit Summary for other counseling recommendations.  Return for pp check in 6 wks  Reva Boresanya S Vali Capano, MD

## 2015-11-23 NOTE — Anesthesia Procedure Notes (Signed)
Epidural Patient location during procedure: OB Start time: 11/23/2015 11:44 AM End time: 11/23/2015 11:51 AM  Staffing Anesthesiologist: Shona SimpsonHOLLIS, Rayburn Mundis D Performed by: anesthesiologist   Preanesthetic Checklist Completed: patient identified, site marked, surgical consent, pre-op evaluation, timeout performed, IV checked, risks and benefits discussed and monitors and equipment checked  Epidural Patient position: sitting Prep: ChloraPrep Patient monitoring: heart rate, continuous pulse ox and blood pressure Approach: midline Location: L3-L4 Injection technique: LOR saline  Needle:  Needle type: Tuohy  Needle gauge: 17 G Needle length: 9 cm Catheter type: closed end flexible Catheter size: 20 Guage Test dose: negative and 1.5% lidocaine  Assessment Events: blood not aspirated, injection not painful, no injection resistance and no paresthesia  Additional Notes LOR @ 6.5  Patient identified. Risks/Benefits/Options discussed with patient including but not limited to bleeding, infection, nerve damage, paralysis, failed block, incomplete pain control, headache, blood pressure changes, nausea, vomiting, reactions to medications, itching and postpartum back pain. Confirmed with bedside nurse the patient's most recent platelet count. Confirmed with patient that they are not currently taking any anticoagulation, have any bleeding history or any family history of bleeding disorders. Patient expressed understanding and wished to proceed. All questions were answered. Sterile technique was used throughout the entire procedure. Please see nursing notes for vital signs. Test dose was given through epidural catheter and negative prior to continuing to dose epidural or start infusion. Warning signs of high block given to the patient including shortness of breath, tingling/numbness in hands, complete motor block, or any concerning symptoms with instructions to call for help. Patient was given instructions  on fall risk and not to get out of bed. All questions and concerns addressed with instructions to call with any issues or inadequate analgesia.    Reason for block:procedure for pain

## 2015-11-23 NOTE — Patient Instructions (Signed)
Breastfeeding Deciding to breastfeed is one of the best choices you can make for you and your baby. A change in hormones during pregnancy causes your breast tissue to grow and increases the number and size of your milk ducts. These hormones also allow proteins, sugars, and fats from your blood supply to make breast milk in your milk-producing glands. Hormones prevent breast milk from being released before your baby is born as well as prompt milk flow after birth. Once breastfeeding has begun, thoughts of your baby, as well as his or her sucking or crying, can stimulate the release of milk from your milk-producing glands.  BENEFITS OF BREASTFEEDING For Your Baby  Your first milk (colostrum) helps your baby's digestive system function better.  There are antibodies in your milk that help your baby fight off infections.  Your baby has a lower incidence of asthma, allergies, and sudden infant death syndrome.  The nutrients in breast milk are better for your baby than infant formulas and are designed uniquely for your baby's needs.  Breast milk improves your baby's brain development.  Your baby is less likely to develop other conditions, such as childhood obesity, asthma, or type 2 diabetes mellitus. For You  Breastfeeding helps to create a very special bond between you and your baby.  Breastfeeding is convenient. Breast milk is always available at the correct temperature and costs nothing.  Breastfeeding helps to burn calories and helps you lose the weight gained during pregnancy.  Breastfeeding makes your uterus contract to its prepregnancy size faster and slows bleeding (lochia) after you give birth.   Breastfeeding helps to lower your risk of developing type 2 diabetes mellitus, osteoporosis, and breast or ovarian cancer later in life. SIGNS THAT YOUR BABY IS HUNGRY Early Signs of Hunger  Increased alertness or activity.  Stretching.  Movement of the head from side to  side.  Movement of the head and opening of the mouth when the corner of the mouth or cheek is stroked (rooting).  Increased sucking sounds, smacking lips, cooing, sighing, or squeaking.  Hand-to-mouth movements.  Increased sucking of fingers or hands. Late Signs of Hunger  Fussing.  Intermittent crying. Extreme Signs of Hunger Signs of extreme hunger will require calming and consoling before your baby will be able to breastfeed successfully. Do not wait for the following signs of extreme hunger to occur before you initiate breastfeeding:  Restlessness.  A loud, strong cry.  Screaming. BREASTFEEDING BASICS Breastfeeding Initiation  Find a comfortable place to sit or lie down, with your neck and back well supported.  Place a pillow or rolled up blanket under your baby to bring him or her to the level of your breast (if you are seated). Nursing pillows are specially designed to help support your arms and your baby while you breastfeed.  Make sure that your baby's abdomen is facing your abdomen.  Gently massage your breast. With your fingertips, massage from your chest wall toward your nipple in a circular motion. This encourages milk flow. You may need to continue this action during the feeding if your milk flows slowly.  Support your breast with 4 fingers underneath and your thumb above your nipple. Make sure your fingers are well away from your nipple and your baby's mouth.  Stroke your baby's lips gently with your finger or nipple.  When your baby's mouth is open wide enough, quickly bring your baby to your breast, placing your entire nipple and as much of the colored area around your nipple (  areola) as possible into your baby's mouth.  More areola should be visible above your baby's upper lip than below the lower lip.  Your baby's tongue should be between his or her lower gum and your breast.  Ensure that your baby's mouth is correctly positioned around your nipple  (latched). Your baby's lips should create a seal on your breast and be turned out (everted).  It is common for your baby to suck about 2-3 minutes in order to start the flow of breast milk. Latching Teaching your baby how to latch on to your breast properly is very important. An improper latch can cause nipple pain and decreased milk supply for you and poor weight gain in your baby. Also, if your baby is not latched onto your nipple properly, he or she may swallow some air during feeding. This can make your baby fussy. Burping your baby when you switch breasts during the feeding can help to get rid of the air. However, teaching your baby to latch on properly is still the best way to prevent fussiness from swallowing air while breastfeeding. Signs that your baby has successfully latched on to your nipple:  Silent tugging or silent sucking, without causing you pain.  Swallowing heard between every 3-4 sucks.  Muscle movement above and in front of his or her ears while sucking. Signs that your baby has not successfully latched on to nipple:  Sucking sounds or smacking sounds from your baby while breastfeeding.  Nipple pain. If you think your baby has not latched on correctly, slip your finger into the corner of your baby's mouth to break the suction and place it between your baby's gums. Attempt breastfeeding initiation again. Signs of Successful Breastfeeding Signs from your baby:  A gradual decrease in the number of sucks or complete cessation of sucking.  Falling asleep.  Relaxation of his or her body.  Retention of a small amount of milk in his or her mouth.  Letting go of your breast by himself or herself. Signs from you:  Breasts that have increased in firmness, weight, and size 1-3 hours after feeding.  Breasts that are softer immediately after breastfeeding.  Increased milk volume, as well as a change in milk consistency and color by the fifth day of breastfeeding.  Nipples  that are not sore, cracked, or bleeding. Signs That Your Baby is Getting Enough Milk  Wetting at least 3 diapers in a 24-hour period. The urine should be clear and pale yellow by age 5 days.  At least 3 stools in a 24-hour period by age 5 days. The stool should be soft and yellow.  At least 3 stools in a 24-hour period by age 7 days. The stool should be seedy and yellow.  No loss of weight greater than 10% of birth weight during the first 3 days of age.  Average weight gain of 4-7 ounces (113-198 g) per week after age 4 days.  Consistent daily weight gain by age 5 days, without weight loss after the age of 2 weeks. After a feeding, your baby may spit up a small amount. This is common. BREASTFEEDING FREQUENCY AND DURATION Frequent feeding will help you make more milk and can prevent sore nipples and breast engorgement. Breastfeed when you feel the need to reduce the fullness of your breasts or when your baby shows signs of hunger. This is called "breastfeeding on demand." Avoid introducing a pacifier to your baby while you are working to establish breastfeeding (the first 4-6 weeks   after your baby is born). After this time you may choose to use a pacifier. Research has shown that pacifier use during the first year of a baby's life decreases the risk of sudden infant death syndrome (SIDS). Allow your baby to feed on each breast as long as he or she wants. Breastfeed until your baby is finished feeding. When your baby unlatches or falls asleep while feeding from the first breast, offer the second breast. Because newborns are often sleepy in the first few weeks of life, you may need to awaken your baby to get him or her to feed. Breastfeeding times will vary from baby to baby. However, the following rules can serve as a guide to help you ensure that your baby is properly fed:  Newborns (babies 4 weeks of age or younger) may breastfeed every 1-3 hours.  Newborns should not go longer than 3 hours  during the day or 5 hours during the night without breastfeeding.  You should breastfeed your baby a minimum of 8 times in a 24-hour period until you begin to introduce solid foods to your baby at around 6 months of age. BREAST MILK PUMPING Pumping and storing breast milk allows you to ensure that your baby is exclusively fed your breast milk, even at times when you are unable to breastfeed. This is especially important if you are going back to work while you are still breastfeeding or when you are not able to be present during feedings. Your lactation consultant can give you guidelines on how long it is safe to store breast milk. A breast pump is a machine that allows you to pump milk from your breast into a sterile bottle. The pumped breast milk can then be stored in a refrigerator or freezer. Some breast pumps are operated by hand, while others use electricity. Ask your lactation consultant which type will work best for you. Breast pumps can be purchased, but some hospitals and breastfeeding support groups lease breast pumps on a monthly basis. A lactation consultant can teach you how to hand express breast milk, if you prefer not to use a pump. CARING FOR YOUR BREASTS WHILE YOU BREASTFEED Nipples can become dry, cracked, and sore while breastfeeding. The following recommendations can help keep your breasts moisturized and healthy:  Avoid using soap on your nipples.  Wear a supportive bra. Although not required, special nursing bras and tank tops are designed to allow access to your breasts for breastfeeding without taking off your entire bra or top. Avoid wearing underwire-style bras or extremely tight bras.  Air dry your nipples for 3-4minutes after each feeding.  Use only cotton bra pads to absorb leaked breast milk. Leaking of breast milk between feedings is normal.  Use lanolin on your nipples after breastfeeding. Lanolin helps to maintain your skin's normal moisture barrier. If you use  pure lanolin, you do not need to wash it off before feeding your baby again. Pure lanolin is not toxic to your baby. You may also hand express a few drops of breast milk and gently massage that milk into your nipples and allow the milk to air dry. In the first few weeks after giving birth, some women experience extremely full breasts (engorgement). Engorgement can make your breasts feel heavy, warm, and tender to the touch. Engorgement peaks within 3-5 days after you give birth. The following recommendations can help ease engorgement:  Completely empty your breasts while breastfeeding or pumping. You may want to start by applying warm, moist heat (in   the shower or with warm water-soaked hand towels) just before feeding or pumping. This increases circulation and helps the milk flow. If your baby does not completely empty your breasts while breastfeeding, pump any extra milk after he or she is finished.  Wear a snug bra (nursing or regular) or tank top for 1-2 days to signal your body to slightly decrease milk production.  Apply ice packs to your breasts, unless this is too uncomfortable for you.  Make sure that your baby is latched on and positioned properly while breastfeeding. If engorgement persists after 48 hours of following these recommendations, contact your health care provider or a lactation consultant. OVERALL HEALTH CARE RECOMMENDATIONS WHILE BREASTFEEDING  Eat healthy foods. Alternate between meals and snacks, eating 3 of each per day. Because what you eat affects your breast milk, some of the foods may make your baby more irritable than usual. Avoid eating these foods if you are sure that they are negatively affecting your baby.  Drink milk, fruit juice, and water to satisfy your thirst (about 10 glasses a day).  Rest often, relax, and continue to take your prenatal vitamins to prevent fatigue, stress, and anemia.  Continue breast self-awareness checks.  Avoid chewing and smoking  tobacco. Chemicals from cigarettes that pass into breast milk and exposure to secondhand smoke may harm your baby.  Avoid alcohol and drug use, including marijuana. Some medicines that may be harmful to your baby can pass through breast milk. It is important to ask your health care provider before taking any medicine, including all over-the-counter and prescription medicine as well as vitamin and herbal supplements. It is possible to become pregnant while breastfeeding. If birth control is desired, ask your health care provider about options that will be safe for your baby. SEEK MEDICAL CARE IF:  You feel like you want to stop breastfeeding or have become frustrated with breastfeeding.  You have painful breasts or nipples.  Your nipples are cracked or bleeding.  Your breasts are red, tender, or warm.  You have a swollen area on either breast.  You have a fever or chills.  You have nausea or vomiting.  You have drainage other than breast milk from your nipples.  Your breasts do not become full before feedings by the fifth day after you give birth.  You feel sad and depressed.  Your baby is too sleepy to eat well.  Your baby is having trouble sleeping.   Your baby is wetting less than 3 diapers in a 24-hour period.  Your baby has less than 3 stools in a 24-hour period.  Your baby's skin or the white part of his or her eyes becomes yellow.   Your baby is not gaining weight by 5 days of age. SEEK IMMEDIATE MEDICAL CARE IF:  Your baby is overly tired (lethargic) and does not want to wake up and feed.  Your baby develops an unexplained fever.   This information is not intended to replace advice given to you by your health care provider. Make sure you discuss any questions you have with your health care provider.   Document Released: 07/15/2005 Document Revised: 04/05/2015 Document Reviewed: 01/06/2013 Elsevier Interactive Patient Education 2016 [employer]  

## 2015-11-23 NOTE — Anesthesia Preprocedure Evaluation (Signed)
Anesthesia Evaluation  Patient identified by MRN, date of birth, ID band Patient awake    Reviewed: Allergy & Precautions, NPO status , Patient's Chart, lab work & pertinent test results  Airway Mallampati: II  TM Distance: >3 FB Neck ROM: Full    Dental  (+) Teeth Intact, Dental Advisory Given   Pulmonary neg pulmonary ROS,    breath sounds clear to auscultation       Cardiovascular negative cardio ROS   Rhythm:Regular Rate:Normal     Neuro/Psych negative neurological ROS  negative psych ROS   GI/Hepatic negative GI ROS, Neg liver ROS,   Endo/Other  negative endocrine ROS  Renal/GU negative Renal ROS  negative genitourinary   Musculoskeletal negative musculoskeletal ROS (+)   Abdominal   Peds negative pediatric ROS (+)  Hematology negative hematology ROS (+)   Anesthesia Other Findings   Reproductive/Obstetrics (+) Pregnancy                             Lab Results  Component Value Date   WBC 13.8* 11/23/2015   HGB 9.4* 11/23/2015   HCT 29.1* 11/23/2015   MCV 76.0* 11/23/2015   PLT 235 11/23/2015   No results found for: INR, PROTIME   Anesthesia Physical Anesthesia Plan  ASA: II  Anesthesia Plan: Epidural   Post-op Pain Management:    Induction:   Airway Management Planned:   Additional Equipment:   Intra-op Plan:   Post-operative Plan:   Informed Consent: I have reviewed the patients History and Physical, chart, labs and discussed the procedure including the risks, benefits and alternatives for the proposed anesthesia with the patient or authorized representative who has indicated his/her understanding and acceptance.     Plan Discussed with:   Anesthesia Plan Comments:         Anesthesia Quick Evaluation

## 2015-11-23 NOTE — Progress Notes (Signed)
Clarisse GougeLucia Luna isa 20 y.o. G1P0000 at 2390w1d by  admitted for active labor  Subjective: Comfortable Pushing with contractions  Objective: BP 137/66 mmHg  Pulse 94  Temp(Src) 98.7 F (37.1 C) (Oral)  Resp 18  Ht 5\' 4"  (1.626 m)  Wt 182 lb (82.555 kg)  BMI 31.22 kg/m2  SpO2 99%  LMP 02/15/2015   Total I/O In: -  Out: 850 [Urine:850]  FHT:  Baseline 135; mod variability; variable decels with pushing UC:   regular, every 2 minutes SVE:   Dilation: 10 Effacement (%): 90 Station: -1 Exam by:: l. Zaina Jenkin, cnm  Labs: Lab Results  Component Value Date   WBC 13.8* 11/23/2015   HGB 9.4* 11/23/2015   HCT 29.1* 11/23/2015   MCV 76.0* 11/23/2015   PLT 235 11/23/2015    Assessment / Plan: Spontaneous labor, progressing normally  Labor: Progressing normally Preeclampsia:   Fetal Wellbeing:  Category II Pain Control:  Epidural I/D:   Anticipated MOD:  NSVD  Huan Pollok Grissett 11/23/2015, 6:59 PM

## 2015-11-24 LAB — CBC
HCT: 24.1 % — ABNORMAL LOW (ref 36.0–46.0)
Hemoglobin: 7.7 g/dL — ABNORMAL LOW (ref 12.0–15.0)
MCH: 24.4 pg — ABNORMAL LOW (ref 26.0–34.0)
MCHC: 32 g/dL (ref 30.0–36.0)
MCV: 76.3 fL — ABNORMAL LOW (ref 78.0–100.0)
Platelets: 219 10*3/uL (ref 150–400)
RBC: 3.16 MIL/uL — ABNORMAL LOW (ref 3.87–5.11)
RDW: 16.3 % — ABNORMAL HIGH (ref 11.5–15.5)
WBC: 16.2 10*3/uL — ABNORMAL HIGH (ref 4.0–10.5)

## 2015-11-24 LAB — RPR: RPR Ser Ql: NONREACTIVE

## 2015-11-24 MED ORDER — FERROUS SULFATE 325 (65 FE) MG PO TABS
325.0000 mg | ORAL_TABLET | Freq: Every day | ORAL | Status: DC
  Administered 2015-11-24 – 2015-11-25 (×2): 325 mg via ORAL
  Filled 2015-11-24 (×2): qty 1

## 2015-11-24 NOTE — Progress Notes (Signed)
Patient reports that she "doesn't feel dizzy anymore" when she is ambulating in the room. No c/o at this time.

## 2015-11-24 NOTE — Progress Notes (Signed)
UR chart review completed.  

## 2015-11-24 NOTE — Progress Notes (Signed)
Post Partum Day 1 Subjective:  Jessica Riddle is a 20 y.o. G1P1001 4682w1d s/p SVD.  No acute events overnight.  Pt denies problems with ambulating, voiding or po intake.  She denies nausea or vomiting.  Pain is well controlled with motrin.  She has had flatus. She has not had bowel movement. Patient endorses dizziness.  Lochia moderate.  Plan for birth control is Nexplanon inplant.  Method of Feeding: breast and bottle  Objective: Blood pressure 104/52, pulse 79, temperature 98.4 F (36.9 C), temperature source Oral, resp. rate 16, height 5\' 4"  (1.626 m), weight 82.555 kg (182 lb), last menstrual period 02/15/2015, SpO2 99 %, unknown if currently breastfeeding.  Physical Exam:  General: alert, cooperative and no distress Lochia:normal flow Chest: CTAB Heart: RRR no m/r/g Abdomen: +BS, soft, nontender Uterine Fundus: firm, below level of umbilicus DVT Evaluation: No evidence of DVT seen on physical exam. Negative Homan's sign Extremities: No edema   Recent Labs  11/23/15 1105  HGB 9.4*  HCT 29.1*    Assessment/Plan:  ASSESSMENT: Jessica Riddle is a 20 y.o. G1P1001 7082w1d PPD#1 s/p SVD of a baby girl. Doing well but is experiencing dizziness.  PLAN:  #S/p SVD: plan for discharge tomorrow Counseled patient on choosing pediatrician #Dizziness: patient had internal vaginal lacerations intrapartum with bleeding and baseline Hgb 9.4 Repeat CBC Monitor BP and sx  #MOF: breast, Lactation to see today #MOC: Nexplanon     LOS: 1 day   Caro HightCara Moses 11/24/2015, 7:06 AM    I have seen and examined this patient and agree the above assessment.  Respiratory effort normal, lochia appropriate, legs negative,  pain level normal. See separate note authored by myself  CRESENZO-DISHMAN,Jenilyn Magana 11/28/2015 11:37 AM

## 2015-11-24 NOTE — Lactation Note (Addendum)
This note was copied from a baby's chart. Lactation Consultation Note  Patient Name: Jessica Riddle ZOXWR'UToday's Date: 11/24/2015 Reason for consult: Initial assessment   Initial consult with first time mom of 16 hour old infant. Infant with 1 BF for 60 minutes, 2 attempts, 1 gtt colostrum on spoon, 1 void and 1 stool since birth. Infant weight 7 lb 5.3 oz. LATCH Scores 6-7.  Mom was feeding infant in recliner in cradle hold without pillow support. She reports infant has been feeding on and off since 10 am.  Discussed using cross cradle or football hold in NB period to assist infant in maintaining a deeper latch. Reviewed BF basics, positioning and pillow support with mom. Showed mom positioning in Taking Care of Baby and Me Booklet. Assisted mom in relatching infant with change in position, infant latched and took a few sucks and fell asleep.   Discussed stomach size, NB nutritional needs, colostrum, milk coming to volume, Cluster feeding, and hand expression with mom. Mom reports she has not been shown to hand express, showed her how and she returned demo. 1 small gtt colostrum noted from left breast, rubbed onto nipple. Mom with small wide spaced breasts with everted nipples, she reports that the left nipple is pink, tissue was intact, enc her to work on positioning infant to prevent further trauma to nipples. Mom does have some nipple tenderness with latch that does improve with feeding, again recommended expressed BM to nipples post feeding.   LC Brochure and BF Resources Handout given, informed mom of BF Support Groups, LC phone # and OP Services. [facility] and make [facility]. Infant has f/u Ped appt scheduled for Monday.  Mom informed me that she has chosen to breast and bottle feed and asked when she would get formula. When asked why she believes she needs to give formula, She reports she wants to give formula as she is not sure if her mom wants her to. Discussed LEAD with  mom and enc her to BF infant 8-12 x in 24 hours at first feeding cues. Discussed that currently infant does not show a medical need for formula. Mom voiced understanding. Mom did report she will be returning to work at some point.   Enc mom to call for assistance as needed. Follow up tomorrow and prn     Maternal Data Formula Feeding for Exclusion: Yes Reason for exclusion: Mother's choice to formula and breast feed on admission Has patient been taught Hand Expression?: Yes Does the patient have breastfeeding experience prior to this delivery?: No  Feeding    LATCH Score/Interventions Latch: Repeated attempts needed to sustain latch, nipple held in mouth throughout feeding, stimulation needed to elicit sucking reflex. Intervention(s): Skin to skin;Teach feeding cues;Waking techniques Intervention(s): Breast massage;Breast compression  Audible Swallowing: None  Type of Nipple: Everted at rest and after stimulation  Comfort (Breast/Nipple): Filling, red/small blisters or bruises, mild/mod discomfort  Problem noted: Mild/Moderate discomfort Interventions (Mild/moderate discomfort): Hand expression (EBM to nipples post feed, deepen latch)  Hold (Positioning): Assistance needed to correctly position infant at breast and maintain latch. Intervention(s): Breastfeeding basics reviewed;Support Pillows;Position options;Skin to skin  LATCH Score: 5  Lactation Tools Discussed/Used WIC Program: Yes   Consult Status Consult Status: Follow-up Date: 11/25/15 Follow-up type: In-patient    Jessica Riddle 11/24/2015, 12:33 PM

## 2015-11-24 NOTE — Progress Notes (Signed)
Post Partum Day 1 Subjective:  up ad lib, voiding and tolerating PO, small lochia, plans to breastfeed, Has had some dizziness.    Objective: Blood pressure 104/52, pulse 79, temperature 98.4 F (36.9 C), temperature source Oral, resp. rate 16, height 5\' 4"  (1.626 m), weight 82.555 kg (182 lb), last menstrual period 02/15/2015, SpO2 99 %, unknown if currently breastfeeding.  Physical Exam:  General: alert, cooperative and no distress Lochia:normal flow Chest: CTAB Heart: RRR no m/r/g Abdomen: +BS, soft, nontender,  Uterine Fundus: firm DVT Evaluation: No evidence of DVT seen on physical exam. Extremities: no edema   Recent Labs  11/23/15 1105  HGB 9.4*  HCT 29.1*    Assessment/Plan: PPD#1 Dizzy--will check H/H   LOS: 1 day   Riddle,Jessica Markel 11/24/2015, 8:55 AM

## 2015-11-24 NOTE — Progress Notes (Signed)
Patient's color has improved and no syncope noted.

## 2015-11-25 MED ORDER — FERROUS SULFATE 325 (65 FE) MG PO TABS: 325.0000 mg | ORAL_TABLET | Freq: Two times a day (BID) | ORAL | Status: DC

## 2015-11-25 MED ORDER — IBUPROFEN 600 MG PO TABS: 600.0000 mg | ORAL_TABLET | Freq: Four times a day (QID) | ORAL | Status: DC

## 2015-11-25 NOTE — Discharge Summary (Signed)
OB Discharge Summary  Patient Name: Jessica Riddle DOB: Jun 27, 1996 MRN: [id number]  Date of admission: 11/23/2015 Delivering MD: Illene Bolus A   Date of discharge: 11/25/2015  Admitting diagnosis: LABOR Intrauterine pregnancy: [redacted]w[redacted]d     Secondary diagnosis:Active Problems:   Supervision of normal first pregnancy, antepartum   Insufficient prenatal care   NSVD (normal spontaneous vaginal delivery)  Additional problems: None    Discharge diagnosis: Term Pregnancy Delivered                                                                     Post partum procedures:none  Augmentation: none  Complications: None  Hospital course:  Onset of Labor With Vaginal Delivery     20 y.o. yo G1P1001 at [redacted]w[redacted]d was admitted in Active Labor on 11/23/2015. Patient had an uncomplicated labor course as follows:  Membrane Rupture Time/Date: 1:06 PM ,11/23/2015   Intrapartum Procedures: Episiotomy: None [1]                                         Lacerations:  Vaginal [6]  Patient had a delivery of a Viable infant. 11/23/2015  Information for the patient's newborn:  Estle, Sabella [[id number]]  Delivery Method: Vaginal, Spontaneous Delivery (Filed from Delivery Summary)    Pateint had an uncomplicated postpartum course.  She is ambulating, tolerating a regular diet, passing flatus, and urinating well. Patient is discharged home in stable condition on 11/25/2015.    Physical exam  Filed Vitals:   11/24/15 0537 11/24/15 1042 11/24/15 1700 11/25/15 0533  BP: 104/52 117/53 109/65 122/62  Pulse: 79 77 88 57  Temp: 98.4 F (36.9 C) 98.6 F (37 C) 98.2 F (36.8 C) 98.1 F (36.7 C)  TempSrc: Oral Oral Oral Oral  Resp: Height:      Weight:      SpO2:  100%     General: alert, cooperative and no distress Lochia: appropriate Uterine Fundus: firm Incision: N/A DVT Evaluation: No evidence of DVT seen on physical exam. Labs: Lab Results  Component Value Date   WBC 16.2*  11/24/2015   HGB 7.7* 11/24/2015   HCT 24.1* 11/24/2015   MCV 76.3* 11/24/2015   PLT 219 11/24/2015   No flowsheet data found.  Discharge instruction: per After Visit Summary and "Baby and Me Booklet".  After Visit Meds:    Medication List    TAKE these medications        ibuprofen 600 MG tablet  Commonly known as:  ADVIL,MOTRIN  Take 1 tablet (600 mg total) by mouth every 6 (six) hours.     multivitamin-prenatal 27-0.8 MG Tabs tablet  Take 1 tablet by mouth daily at 12 noon.        Diet: routine diet  Activity: Advance as tolerated. Pelvic rest for 6 weeks.   Outpatient follow up:6 weeks Follow up Appt:No future appointments. Follow up visit: No Follow-up on file.  Postpartum contraception: Undecided  Newborn Data: Live born female  Birth Weight: 7 lb 5.3 oz (3325 g) APGAR: 9, 9  Baby Feeding: Bottle and Breast Disposition:home with mother  11/25/2015 Hilton SinclairKaty D Mayo, MD   I spoke with and examined patient and agree with resident/PA/SNM's note and plan of care.  Eating, drinking, voiding, ambulating well.  +flatus.  Lochia and pain wnl.  Denies dizziness, lightheadedness, or sob. No complaints. Rx Fe BID, increase Fe-rich foods, continue pnv. Undecided about contraception as she is self-pay, discussed options- wants to think about it. Abstinence until pp visit/contraception. Rooming-in w/ baby d/t hyperbilirubinemia.  Cheral MarkerKimberly R. Booker, CNM, [facility]NP-BC 11/25/2015 10:26 AM

## 2015-11-25 NOTE — Lactation Note (Signed)
This note was copied from a baby's chart. Lactation Consultation Note:  Mother has a small crack on the (R) nipple and a scab/positional strip on the (L) nipple. Mother complaints of pain on the (L) nipple. She states she has not breastfed on (L) for more than 24 hours. Mother also has a very wide finger span between her breast. She hand expresses colostrum easily.   Assist mother with latching infant on the (R) breast in football hold. Infant sustained latch for 15 mins.  Mother states that latch felt much better with infants lips widely flanged. Infant placed in MGM arms.  Mother was sat up with a DEBP. Mother was advised to pump after each feeding and supplement with EBM/formula. Infant is jaundice and is sleepy at the breast.  Mother was also given a harmony hand pump. She is active with WIC.  Patient Name: Jessica Clarisse GougeLucia Luna WUJWJ'XToday's Date: 11/25/2015 Reason for consult: Follow-up assessment   Maternal Data    Feeding Feeding Type: Breast Fed Length of feed: 15 min  LATCH Score/Interventions Latch: Grasps breast easily, tongue down, lips flanged, rhythmical sucking. Intervention(s): Skin to skin;Teach feeding cues;Waking techniques Intervention(s): Adjust position;Assist with latch;Breast compression  Audible Swallowing: A few with stimulation  Type of Nipple: Everted at rest and after stimulation  Comfort (Breast/Nipple): Filling, red/small blisters or bruises, mild/mod discomfort Problem noted: Cracked, bleeding, blisters, bruises Intervention(s): Expressed breast milk to nipple;Hand pump;Double electric pump  Problem noted: Mild/Moderate discomfort Interventions (Mild/moderate discomfort): Comfort gels  Hold (Positioning): Assistance needed to correctly position infant at breast and maintain latch.  LATCH Score: 7  Lactation Tools Discussed/Used     Consult Status Consult Status: Follow-up Date: 11/25/15 Follow-up type: In-patient    Stevan BornKendrick, Nestor Wieneke  [facility] 11/25/2015, 2:26 PM

## 2015-11-25 NOTE — Discharge Instructions (Signed)
NO SEX UNTIL AFTER YOU GET YOUR BIRTH CONTROL   Postpartum Care After Vaginal Delivery After you deliver your newborn (postpartum period), the usual stay in the hospital is 24-72 hours. If there were problems with your labor or delivery, or if you have other medical problems, you might be in the hospital longer.  While you are in the hospital, you will receive help and instructions on how to care for yourself and your newborn during the postpartum period.  While you are in the hospital:  Be sure to tell your nurses if you have pain or discomfort, as well as where you feel the pain and what makes the pain worse.  If you had an incision made near your vagina (episiotomy) or if you had some tearing during delivery, the nurses may put ice packs on your episiotomy or tear. The ice packs may help to reduce the pain and swelling.  If you are breastfeeding, you may feel uncomfortable contractions of your uterus for a couple of weeks. This is normal. The contractions help your uterus get back to normal size.  It is normal to have some bleeding after delivery.  For the first 1-3 days after delivery, the flow is red and the amount may be similar to a period.  It is common for the flow to start and stop.  In the first few days, you may pass some small clots. Let your nurses know if you begin to pass large clots or your flow increases.  Do not  flush blood clots down the toilet before having the nurse look at them.  During the next 3-10 days after delivery, your flow should become more watery and pink or brown-tinged in color.  Ten to fourteen days after delivery, your flow should be a small amount of yellowish-white discharge.  The amount of your flow will decrease over the first few weeks after delivery. Your flow may stop in 6-8 weeks. Most women have had their flow stop by 12 weeks after delivery.  You should change your sanitary pads frequently.  Wash your hands thoroughly with soap and water  for at least 20 seconds after changing pads, using the toilet, or before holding or feeding your newborn.  You should feel like you need to empty your bladder within the first 6-8 hours after delivery.  In case you become weak, lightheaded, or faint, call your nurse before you get out of bed for the first time and before you take a shower for the first time.  Within the first few days after delivery, your breasts may begin to feel tender and full. This is called engorgement. Breast tenderness usually goes away within 48-72 hours after engorgement occurs. You may also notice milk leaking from your breasts. If you are not breastfeeding, do not stimulate your breasts. Breast stimulation can make your breasts produce more milk.  Spending as much time as possible with your newborn is very important. During this time, you and your newborn can feel close and get to know each other. Having your newborn stay in your room (rooming in) will help to strengthen the bond with your newborn. It will give you time to get to know your newborn and become comfortable caring for your newborn.  Your hormones change after delivery. Sometimes the hormone changes can temporarily cause you to feel sad or tearful. These feelings should not last more than a few days. If these feelings last longer than that, you should talk to your caregiver.  If desired,  talk to your caregiver about methods of family planning or contraception.  Talk to your caregiver about immunizations. Your caregiver may want you to have the following immunizations before leaving the hospital:  Tetanus, diphtheria, and pertussis (Tdap) or tetanus and diphtheria (Td) immunization. It is very important that you and your family (including grandparents) or others caring for your newborn are up-to-date with the Tdap or Td immunizations. The Tdap or Td immunization can help protect your newborn from getting ill.  Rubella immunization.  Varicella (chickenpox)  immunization.  Influenza immunization. You should receive this annual immunization if you did not receive the immunization during your pregnancy.   This information is not intended to replace advice given to you by your health care provider. Make sure you discuss any questions you have with your health care provider.   Document Released: 05/12/2007 Document Revised: 04/08/2012 Document Reviewed: 03/11/2012 Elsevier Interactive Patient Education 2016 [employer].  Iron-Rich Diet Iron is a mineral that helps your body to produce hemoglobin. Hemoglobin is a protein in your red blood cells that carries oxygen to your body's tissues. Eating too little iron may cause you to feel weak and tired, and it can increase your risk for infection. Eating enough iron is necessary for your body's metabolism, muscle function, and nervous system. Iron is naturally found in many foods. It can also be added to foods or fortified in foods. There are two types of dietary iron:  Heme iron. Heme iron is absorbed by the body more easily than nonheme iron. Heme iron is found in meat, poultry, and fish.  Nonheme iron. Nonheme iron is found in dietary supplements, iron-fortified grains, beans, and vegetables. You may need to follow an iron-rich diet if:  You have been diagnosed with iron deficiency or iron-deficiency anemia.  You have a condition that prevents you from absorbing dietary iron, such as:  Infection in your intestines.  Celiac disease. This involves long-lasting (chronic) inflammation of your intestines.  You do not eat enough iron.  You eat a diet that is high in foods that impair iron absorption.  You have lost a lot of blood.  You have heavy bleeding during your menstrual cycle.  You are pregnant. WHAT IS MY PLAN? Your health care provider may help you to determine how much iron you need per day based on your condition. Generally, when a person consumes sufficient amounts of iron in the  diet, the following iron needs are met:  Men.  61-29 years old: 11 mg per day.  65-72 years old: 8 mg per day.  Women.   3-46 years old: 15 mg per day.  66-41 years old: 18 mg per day.  Over 70 years old: 8 mg per day.  Pregnant women: 27 mg per day.  Breastfeeding women: 9 mg per day. WHAT DO I NEED TO KNOW ABOUT AN IRON-RICH DIET?  Eat fresh fruits and vegetables that are high in vitamin C along with foods that are high in iron. This will help increase the amount of iron that your body absorbs from food, especially with foods containing nonheme iron. Foods that are high in vitamin C include oranges, peppers, tomatoes, and mango.  Take iron supplements only as directed by your health care provider. Overdose of iron can be life-threatening. If you were prescribed iron supplements, take them with orange juice or a vitamin C supplement.  Cook foods in pots and pans that are made from iron.   Eat nonheme iron-containing foods alongside foods that are  high in heme iron. This helps to improve your iron absorption.   Certain foods and drinks contain compounds that impair iron absorption. Avoid eating these foods in the same meal as iron-rich foods or with iron supplements. These include:  Coffee, black tea, and red wine.  Milk, dairy products, and foods that are high in calcium.  Beans, soybeans, and peas.  Whole grains.  When eating foods that contain both nonheme iron and compounds that impair iron absorption, follow these tips to absorb iron better.   Soak beans overnight before cooking.  Soak whole grains overnight and drain them before using.  Ferment flours before baking, such as using yeast in bread dough. WHAT FOODS CAN I EAT? Grains Iron-fortified breakfast cereal. Iron-fortified whole-wheat bread. Enriched rice. Sprouted grains. Vegetables Spinach. Potatoes with skin. Green peas. Broccoli. Red and green bell peppers. Fermented vegetables. Fruits Prunes.  Raisins. Oranges. Strawberries. Mango. Grapefruit. Meats and Other Protein Sources Beef liver. Oysters. Beef. Shrimp. [place]. Chicken. [place]. Sardines. Chickpeas. Nuts. Tofu. Beverages Tomato juice. Fresh orange juice. Prune juice. Hibiscus tea. Fortified instant breakfast shakes. Condiments Tahini. Fermented soy sauce. Sweets and Desserts Black-strap molasses.  Other Wheat germ. The items listed above may not be a complete list of recommended foods or beverages. Contact your dietitian for more options. WHAT FOODS ARE NOT RECOMMENDED? Grains Whole grains. Bran cereal. Bran flour. Oats. Vegetables Artichokes. Brussels sprouts. Kale. Fruits Blueberries. Raspberries. Strawberries. Figs. Meats and Other Protein Sources Soybeans. Products made from soy protein. Dairy Milk. Cream. Cheese. Yogurt. Cottage cheese. Beverages Coffee. Black tea. Red wine. Sweets and Desserts Cocoa. Chocolate. Ice cream. Other Basil. Oregano. Parsley. The items listed above may not be a complete list of foods and beverages to avoid. Contact your dietitian for more information.   This information is not intended to replace advice given to you by your health care provider. Make sure you discuss any questions you have with your health care provider.   Document Released: 02/26/2005 Document Revised: 08/05/2014 Document Reviewed: 02/09/2014 Elsevier Interactive Patient Education [employer].

## 2016-02-15 ENCOUNTER — Other Ambulatory Visit: Payer: Self-pay | Admitting: Women's Health

## 2016-03-05 ENCOUNTER — Other Ambulatory Visit: Payer: Self-pay | Admitting: Internal Medicine

## 2016-07-29 HISTORY — PX: CHOLECYSTECTOMY: SHX55

## 2016-07-29 NOTE — L&D Delivery Note (Signed)
Delivery Note Pt became complete at [facility] and labored down until approx 0150. She pushed well and at 2:02 AM a viable female was delivered via Vaginal, Spontaneous Delivery (Presentation: OA ).  APGAR: 8,9 ; weight: pending . Nuchal cord x 1 reduced prior to delivery. Infant dried and placed on pt's abd; cord clamped and cut by FOB; hospital cord blood sample collected. Placenta status: spont, intact .  Cord: 3 vessel  Anesthesia: Epidural  Episiotomy: None Lacerations: None Est. Blood Loss (mL): 250  Mom to postpartum.  Baby to Couplet care / Skin to Skin.  Cam Hai CNM 03/18/2017, 2:18 AM

## 2016-08-29 ENCOUNTER — Other Ambulatory Visit (HOSPITAL_COMMUNITY): Payer: Self-pay | Admitting: Nurse Practitioner

## 2016-08-29 DIAGNOSIS — Z3682 Encounter for antenatal screening for nuchal translucency: Secondary | ICD-10-CM

## 2016-08-29 DIAGNOSIS — Z3A13 13 weeks gestation of pregnancy: Secondary | ICD-10-CM

## 2016-09-04 ENCOUNTER — Encounter (HOSPITAL_COMMUNITY): Payer: Self-pay | Admitting: *Deleted

## 2016-09-05 ENCOUNTER — Encounter (HOSPITAL_COMMUNITY): Payer: Self-pay

## 2016-09-05 ENCOUNTER — Ambulatory Visit (HOSPITAL_COMMUNITY)
Admission: RE | Admit: 2016-09-05 | Discharge: 2016-09-05 | Disposition: A | Discharge status: Home / Self Care | Payer: Medicaid Other | Source: Ambulatory Visit | Attending: Nurse Practitioner | Admitting: Nurse Practitioner

## 2016-09-05 DIAGNOSIS — Z3682 Encounter for antenatal screening for nuchal translucency: Secondary | ICD-10-CM | POA: Diagnosis present

## 2016-09-05 DIAGNOSIS — O09891 Supervision of other high risk pregnancies, first trimester: Secondary | ICD-10-CM | POA: Insufficient documentation

## 2016-09-05 DIAGNOSIS — Z3A13 13 weeks gestation of pregnancy: Secondary | ICD-10-CM | POA: Insufficient documentation

## 2016-09-10 ENCOUNTER — Other Ambulatory Visit: Payer: Self-pay

## 2016-09-15 LAB — OB RESULTS CONSOLE GC/CHLAMYDIA
CHLAMYDIA, DNA PROBE: NEGATIVE
GC PROBE AMP, GENITAL: NEGATIVE

## 2016-09-15 LAB — OB RESULTS CONSOLE RPR: RPR: NONREACTIVE

## 2016-09-15 LAB — OB RESULTS CONSOLE HIV ANTIBODY (ROUTINE TESTING): HIV: NONREACTIVE

## 2016-09-15 LAB — OB RESULTS CONSOLE ABO/RH: RH Type: POSITIVE

## 2016-09-15 LAB — OB RESULTS CONSOLE ANTIBODY SCREEN: ANTIBODY SCREEN: NEGATIVE

## 2016-09-15 LAB — OB RESULTS CONSOLE HEPATITIS B SURFACE ANTIGEN: Hepatitis B Surface Ag: NEGATIVE

## 2016-09-15 LAB — OB RESULTS CONSOLE RUBELLA ANTIBODY, IGM: RUBELLA: IMMUNE

## 2016-12-04 ENCOUNTER — Encounter (HOSPITAL_COMMUNITY): Payer: Self-pay | Admitting: Emergency Medicine

## 2016-12-04 ENCOUNTER — Emergency Department (HOSPITAL_COMMUNITY): Payer: Self-pay

## 2016-12-04 ENCOUNTER — Emergency Department (HOSPITAL_COMMUNITY)
Admission: EM | Admit: 2016-12-04 | Discharge: 2016-12-04 | Disposition: A | Discharge status: Home / Self Care | Payer: Self-pay | Attending: Emergency Medicine | Admitting: Emergency Medicine

## 2016-12-04 DIAGNOSIS — O99612 Diseases of the digestive system complicating pregnancy, second trimester: Secondary | ICD-10-CM | POA: Insufficient documentation

## 2016-12-04 DIAGNOSIS — K8020 Calculus of gallbladder without cholecystitis without obstruction: Secondary | ICD-10-CM | POA: Insufficient documentation

## 2016-12-04 DIAGNOSIS — Z3A26 26 weeks gestation of pregnancy: Secondary | ICD-10-CM | POA: Insufficient documentation

## 2016-12-04 LAB — COMPREHENSIVE METABOLIC PANEL
ALBUMIN: 3 g/dL — AB (ref 3.5–5.0)
ALT: 11 U/L — ABNORMAL LOW (ref 14–54)
ANION GAP: 7 (ref 5–15)
AST: 19 U/L (ref 15–41)
Alkaline Phosphatase: 94 U/L (ref 38–126)
BUN: 9 mg/dL (ref 6–20)
CALCIUM: 9 mg/dL (ref 8.9–10.3)
CO2: 23 mmol/L (ref 22–32)
Chloride: 108 mmol/L (ref 101–111)
Creatinine, Ser: 0.48 mg/dL (ref 0.44–1.00)
GFR calc Af Amer: >60 mL/min (ref >60)
GFR calc non Af Amer: >60 mL/min (ref >60)
GLUCOSE: 80 mg/dL (ref 65–99)
Potassium: 3.7 mmol/L (ref 3.5–5.1)
SODIUM: 138 mmol/L (ref 135–145)
Total Bilirubin: 0.4 mg/dL (ref 0.3–1.2)
Total Protein: 7.1 g/dL (ref 6.5–8.1)

## 2016-12-04 LAB — CBC
HCT: 32.2 % — ABNORMAL LOW (ref 36.0–46.0)
Hemoglobin: 10.8 g/dL — ABNORMAL LOW (ref 12.0–15.0)
MCH: 29.2 pg (ref 26.0–34.0)
MCHC: 33.5 g/dL (ref 30.0–36.0)
MCV: 87 fL (ref 78.0–100.0)
Platelets: 257 10*3/uL (ref 150–400)
RBC: 3.7 MIL/uL — AB (ref 3.87–5.11)
RDW: 14.5 % (ref 11.5–15.5)
WBC: 16.7 10*3/uL — ABNORMAL HIGH (ref 4.0–10.5)

## 2016-12-04 LAB — LIPASE, BLOOD: LIPASE: 29 U/L (ref 11–51)

## 2016-12-04 MED ORDER — MORPHINE SULFATE (PF) 4 MG/ML IV SOLN: 4.0000 mg | Freq: Once | INTRAVENOUS | Status: DC

## 2016-12-04 NOTE — ED Provider Notes (Signed)
[facility]-EMERGENCY DEPT Provider Note   CSN: [id number] Arrival date & time: 12/04/16  1540     History   Chief Complaint Chief Complaint  Patient presents with  . Abdominal Pain    pregnant    HPI Jessica Riddle is a 21 y.o. female.  Patient who is [redacted] weeks pregnant presents with epigastric, right upper quadrant and right lower quadrant abdominal pain radiating to her back that began approximately 2 hours ago. She states that she had a normal breakfast this, took a nap and then woke up due to pain. She also endorses one episode of vomiting today. Denies hematemesis or blood in stool. Reports normal bowel movements. She reports she can still feel the baby moving. Denies any dysuria or blood in urine. Denies vaginal bleeding, vaginal discharge or leakage of fluids. Denies history of gallbladder issues in the past. She reports some chest pain in the midsternal region. Denies any shortness of breath, hemoptysis, leg swelling, history of clots, headache, blurry vision.      Past Medical History:  Diagnosis Date  . Medical history non-contributory     Patient Active Problem List   Diagnosis Date Noted  . NSVD (normal spontaneous vaginal delivery) 11/25/2015  . Supervision of normal first pregnancy, antepartum 08/16/2015  . Insufficient prenatal care 08/16/2015    Past Surgical History:  Procedure Laterality Date  . NO PAST SURGERIES      OB History    Gravida Para Term Preterm AB Living   2 1 1  0 0 1   SAB TAB Ectopic Multiple Live Births   0 0 0 0 1       Home Medications    Prior to Admission medications   Medication Sig Start Date End Date Taking? Authorizing Provider  acetaminophen (TYLENOL) 500 MG tablet Take 1,000 mg by mouth every 6 (six) hours as needed.   Yes [provider]  Prenatal Vit-Fe Fumarate-FA (MULTIVITAMIN-PRENATAL) 27-0.8 MG TABS tablet Take 1 tablet by mouth daily at 12 noon.   Yes [provider]  ferrous sulfate 325 (65  FE) MG tablet Take 1 tablet (325 mg total) by mouth 2 (two) times daily with a meal. Patient not taking: Reported on 09/05/2016 11/25/15   Cheral Marker, CNM  ibuprofen (ADVIL,MOTRIN) 600 MG tablet Take 1 tablet (600 mg total) by mouth every 6 (six) hours. Patient not taking: Reported on 09/05/2016 11/25/15   Mayo, Allyn Kenner, MD    Family History Family History  Problem Relation Age of Onset  . Diabetes Maternal Grandmother     Social History Social History  Substance Use Topics  . Smoking status: Never Smoker  . Smokeless tobacco: Never Used  . Alcohol use No     Allergies   Patient has no known allergies.   Review of Systems Review of Systems  Constitutional: Negative for appetite change, chills and fever.  HENT: Negative for ear pain, rhinorrhea, sneezing and sore throat.   Eyes: Negative for photophobia and visual disturbance.  Respiratory: Negative for cough, chest tightness, shortness of breath and wheezing.   Cardiovascular: Positive for chest pain. Negative for palpitations and leg swelling.  Gastrointestinal: Positive for abdominal pain, nausea and vomiting. Negative for blood in stool, constipation and diarrhea.  Genitourinary: Negative for dysuria, hematuria and urgency.  Musculoskeletal: Positive for back pain. Negative for myalgias.  Skin: Negative for rash.  Neurological: Negative for dizziness, weakness and light-headedness.     Physical Exam Updated Vital Signs BP (!) 108/52 (BP  Location: Left Arm)   Pulse 100   Temp 98.3 F (36.8 C) (Oral)   Resp 18   Ht 5\' 4"  (1.626 m)   Wt 86.6 kg   LMP 06/05/2016   SpO2 99%   BMI 32.79 kg/m   Physical Exam  Constitutional: She appears well-developed and well-nourished. No distress.  HENT:  Head: Normocephalic and atraumatic.  Nose: Nose normal.  Eyes: Conjunctivae and EOM are normal. Right eye exhibits no discharge. Left eye exhibits no discharge. No scleral icterus.  Neck: Normal range of motion. Neck  supple.  Cardiovascular: Normal rate, regular rhythm, normal heart sounds and intact distal pulses.  Exam reveals no gallop and no friction rub.   No murmur heard. Pulmonary/Chest: Effort normal and breath sounds normal. No respiratory distress.  Abdominal: Soft. Bowel sounds are normal. She exhibits no distension. There is tenderness (epigastric, LUQ, RUQ). There is no guarding.  Musculoskeletal: Normal range of motion. She exhibits no edema.  Neurological: She is alert. She exhibits normal muscle tone. Coordination normal.  Skin: Skin is warm and dry. No rash noted.  Psychiatric: She has a normal mood and affect.  Nursing note and vitals reviewed.    ED Treatments / Results  Labs (all labs ordered are listed, but only abnormal results are displayed) Labs Reviewed  COMPREHENSIVE METABOLIC PANEL - Abnormal; Notable for the following:       Result Value   Albumin 3.0 (*)    ALT 11 (*)    All other components within normal limits  CBC - Abnormal; Notable for the following:    WBC 16.7 (*)    RBC 3.70 (*)    Hemoglobin 10.8 (*)    HCT 32.2 (*)    All other components within normal limits  LIPASE, BLOOD  URINALYSIS, ROUTINE W REFLEX MICROSCOPIC    EKG  EKG Interpretation  Date/Time:  Wednesday Dec 04 2016 16:02:15 EDT Ventricular Rate:  87 PR Interval:    QRS Duration: 80 QT Interval:  352 QTC Calculation: 424 R Axis:   34 Text Interpretation:  Sinus rhythm Baseline wander in lead(s) I III aVL No old tracing to compare Confirmed by Mancel Bale [phone]) on 12/04/2016 6:37:01 PM       Radiology US Abdomen Limited  Result Date: 12/04/2016 CLINICAL DATA:  Epigastric pain this morning. Patient is [redacted] weeks pregnant. EXAM: US ABDOMEN LIMITED - RIGHT UPPER QUADRANT COMPARISON:  None. FINDINGS: Gallbladder: Physiologically distended. Non mobile 1.3 cm stone in the gallbladder neck. Borderline gallbladder wall thickness of 3 mm. No sonographic Murphy sign noted by sonographer.  Common bile duct: Diameter: 3.5 mm Liver: No focal lesion identified. Within normal limits in parenchymal echogenicity. Normal directional flow in the main portal vein. IMPRESSION: Non mobile 1.3 cm gallstone in the gallbladder neck with borderline 3 mm gallbladder wall thickness. This is equivocal for acute cholecystitis, however a negative sonographic Murphy's sign argues against acute gallbladder inflammation. No biliary dilatation. Electronically Signed   By: Rubye Oaks M.D.   On: 12/04/2016 19:05    Procedures Procedures (including critical care time)  Medications Ordered in ED Medications - No data to display   Initial Impression / Assessment and Plan / ED Course  I have reviewed the triage vital signs and the nursing notes.  Pertinent labs & imaging results that were available during my care of the patient were reviewed by me and considered in my medical decision making (see chart for details).     Patient's history and symptoms  concerning for active labor versus cholecystitis versus pancreatitis. Rapid OB response paged. Patient which showed good fetal movement and no signs of active labor this time. Patient is not having any vaginal bleeding or leakage of fluids. She was cleared by OB/GYN for further medical evaluation. No changes to LFTs at this time. Due to pregnancy and RUQ pain, will obtain RUQ U/S to r/o any acute gallbladder disease. This revealed nonmobile 1.3 cm gallstone in the gallbladder neck with borderline 3 mm gallbladder wall thickness. There was negative sonographic Eulah PontMurphy sign present which argues against acute gallbladder inflammation. Symptoms could be due to symptomatic gallstone. Consult to general surgery who stated that they would prefer if patient waited until after delivery for any further evaluation. They recommended pain control with Tylenol and low-fat diet. There is leukocytosis at 16.7 however she is pregnant and this could be the cause. She is  afebrile at this time and no history of fever. Patient was provided information on this. Patient is now pain-free in the ED without any intervention and appears stable for discharge. Advised to follow up with OB/GYN as regularly scheduled. Schedule appointment with surgery after delivery for further evaluation. Return precautions given.     Final Clinical Impressions(s) / ED Diagnoses   Final diagnoses:  Gall bladder stones    New Prescriptions New Prescriptions   No medications on file     Dietrich PatesKhatri, Lorrie Strauch, Cordelia Poche-C 12/04/16 1953    Mancel BaleWentz, Elliott, MD 12/09/16 [phone]

## 2016-12-04 NOTE — ED Provider Notes (Signed)
  Face-to-face evaluation   History: Onset of midepigastric pain radiating to bilateral flanks, today.  No lower abdominal cramping, vaginal bleeding or vaginal drainage.  Pain is spontaneously improved.  Physical exam: Abdomen soft and nontender to palpation.  No costovertebral angle tenderness with percussion.  Medical screening examination/treatment/procedure(s) were conducted as a shared visit with non-physician practitioner(s) and myself.  I personally evaluated the patient during the encounter   Mancel BaleWentz, Karmyn Lowman, MD 12/09/16 [phone]

## 2016-12-04 NOTE — Discharge Instructions (Signed)
Read attached information regarding low fat diet to control symptoms. Take Tylenol as needed for pain. Schedule appointment with surgery after delivery for further evaluation and treatment. Follow-up with OB/GYN as needed. Return to ED for worsening pain, fever, increased vomiting, blood in stool.

## 2016-12-04 NOTE — Progress Notes (Addendum)
1618  Arrived to evaluate this 21 yo G2P1 @ 26.[redacted] wks GA in with complaint of upper abdominal/ epigastric pain that radiates to her upper back.  She says that it "feels like my back is going to explode."  She denies vaginal bleeding, LOF from vagina or UCs.  She reports good fetal movement.  She reports uncomplicated vaginal delivery with previous pregnancy and no problems or concerns with this pregnancy. 1655  ED PA notified FHR reassuring and no UCs or signs of labor.  1702  Call placed to Dr. Alvester MorinNewton, [facility].  She is in surgery and requests return call in 15 min.  1719  Dr. Alvester MorinNewton notified of patient in ED, of above, and of ED POC and lab results.  She recommends RUQ abdominal US or MRI as desired by EDP.  She states that the patient can be OB cleared at this time, but requests follow up call if pt requires admission.

## 2016-12-04 NOTE — ED Notes (Signed)
Rapid OB paged.  

## 2016-12-04 NOTE — ED Triage Notes (Addendum)
Pt [redacted] weeks pregnant c/o constant sharp epigastric abdominal pain radiating to lower back, SOB, emesis onset 1530 today. No vaginal discharge or bleeding. [facility]

## 2016-12-04 NOTE — ED Notes (Signed)
Bed: WA21 Expected date:  Expected time:  Means of arrival:  Comments: TR2 

## 2017-02-11 LAB — OB RESULTS CONSOLE GBS: STREP GROUP B AG: NEGATIVE

## 2017-02-11 LAB — OB RESULTS CONSOLE GC/CHLAMYDIA
CHLAMYDIA, DNA PROBE: NEGATIVE
GC PROBE AMP, GENITAL: NEGATIVE

## 2017-03-13 ENCOUNTER — Telehealth (HOSPITAL_COMMUNITY): Payer: Self-pay | Admitting: *Deleted

## 2017-03-13 NOTE — Telephone Encounter (Signed)
Preadmission screen  

## 2017-03-14 ENCOUNTER — Other Ambulatory Visit: Payer: Self-pay | Admitting: Advanced Practice Midwife

## 2017-03-17 ENCOUNTER — Encounter (HOSPITAL_COMMUNITY): Payer: Self-pay | Admitting: *Deleted

## 2017-03-17 ENCOUNTER — Inpatient Hospital Stay (HOSPITAL_COMMUNITY)
Admission: AD | Admit: 2017-03-17 | Discharge: 2017-03-17 | Disposition: A | Discharge status: Home / Self Care | Payer: Medicaid Other | Source: Ambulatory Visit | Attending: Obstetrics & Gynecology | Admitting: Obstetrics & Gynecology

## 2017-03-17 ENCOUNTER — Inpatient Hospital Stay (HOSPITAL_COMMUNITY): Payer: Medicaid Other | Admitting: Anesthesiology

## 2017-03-17 ENCOUNTER — Inpatient Hospital Stay (HOSPITAL_COMMUNITY)
Admission: AD | Admit: 2017-03-17 | Discharge: 2017-03-19 | DRG: 775 | Disposition: A | Discharge status: Home / Self Care | Payer: Medicaid Other | Source: Ambulatory Visit | Attending: Obstetrics and Gynecology | Admitting: Obstetrics and Gynecology

## 2017-03-17 DIAGNOSIS — O479 False labor, unspecified: Secondary | ICD-10-CM

## 2017-03-17 DIAGNOSIS — Z3A40 40 weeks gestation of pregnancy: Secondary | ICD-10-CM

## 2017-03-17 DIAGNOSIS — Z3493 Encounter for supervision of normal pregnancy, unspecified, third trimester: Secondary | ICD-10-CM | POA: Diagnosis present

## 2017-03-17 DIAGNOSIS — O471 False labor at or after 37 completed weeks of gestation: Secondary | ICD-10-CM | POA: Insufficient documentation

## 2017-03-17 HISTORY — DX: Calculus of gallbladder without cholecystitis without obstruction: K80.20

## 2017-03-17 LAB — CBC
HCT: 32.4 % — ABNORMAL LOW (ref 36.0–46.0)
HEMOGLOBIN: 10.5 g/dL — AB (ref 12.0–15.0)
MCH: 26 pg (ref 26.0–34.0)
MCHC: 32.4 g/dL (ref 30.0–36.0)
MCV: 80.2 fL (ref 78.0–100.0)
PLATELETS: 257 10*3/uL (ref 150–400)
RBC: 4.04 MIL/uL (ref 3.87–5.11)
RDW: 16.4 % — ABNORMAL HIGH (ref 11.5–15.5)
WBC: 13.5 10*3/uL — AB (ref 4.0–10.5)

## 2017-03-17 LAB — TYPE AND SCREEN
ABO/RH(D): O POS
Antibody Screen: NEGATIVE

## 2017-03-17 MED ORDER — OXYTOCIN BOLUS FROM INFUSION
500.0000 mL | Freq: Once | INTRAVENOUS | Status: AC
  Administered 2017-03-18: 500 mL via INTRAVENOUS

## 2017-03-17 MED ORDER — PHENYLEPHRINE 40 MCG/ML (10ML) SYRINGE FOR IV PUSH (FOR BLOOD PRESSURE SUPPORT)
80.0000 ug | PREFILLED_SYRINGE | INTRAVENOUS | Status: DC | PRN
  Filled 2017-03-17: qty 5
  Filled 2017-03-17: qty 10

## 2017-03-17 MED ORDER — EPHEDRINE 5 MG/ML INJ
10.0000 mg | INTRAVENOUS | Status: DC | PRN
  Filled 2017-03-17: qty 2

## 2017-03-17 MED ORDER — OXYCODONE-ACETAMINOPHEN 5-325 MG PO TABS: 1.0000 | ORAL_TABLET | ORAL | Status: DC | PRN

## 2017-03-17 MED ORDER — LIDOCAINE HCL (PF) 1 % IJ SOLN
INTRAMUSCULAR | Status: DC | PRN
  Administered 2017-03-17: 7 mL via EPIDURAL
  Administered 2017-03-17: 6 mL via EPIDURAL

## 2017-03-17 MED ORDER — FLEET ENEMA 7-19 GM/118ML RE ENEM: 1.0000 | ENEMA | RECTAL | Status: DC | PRN

## 2017-03-17 MED ORDER — OXYCODONE-ACETAMINOPHEN 5-325 MG PO TABS: 2.0000 | ORAL_TABLET | ORAL | Status: DC | PRN

## 2017-03-17 MED ORDER — SOD CITRATE-CITRIC ACID 500-334 MG/5ML PO SOLN: 30.0000 mL | ORAL | Status: DC | PRN

## 2017-03-17 MED ORDER — ACETAMINOPHEN 325 MG PO TABS
650.0000 mg | ORAL_TABLET | ORAL | Status: DC | PRN
  Filled 2017-03-17 (×2): qty 2

## 2017-03-17 MED ORDER — LACTATED RINGERS IV SOLN: INTRAVENOUS | Status: DC

## 2017-03-17 MED ORDER — LACTATED RINGERS IV SOLN: 500.0000 mL | Freq: Once | INTRAVENOUS | Status: DC

## 2017-03-17 MED ORDER — FENTANYL 2.5 MCG/ML BUPIVACAINE 1/10 % EPIDURAL INFUSION (WH - ANES)
14.0000 mL/h | INTRAMUSCULAR | Status: DC | PRN
  Administered 2017-03-17 (×2): 14 mL/h via EPIDURAL
  Filled 2017-03-17 (×2): qty 100

## 2017-03-17 MED ORDER — LIDOCAINE HCL (PF) 1 % IJ SOLN
30.0000 mL | INTRAMUSCULAR | Status: DC | PRN
  Filled 2017-03-17: qty 30

## 2017-03-17 MED ORDER — FENTANYL CITRATE (PF) 100 MCG/2ML IJ SOLN: 100.0000 ug | INTRAMUSCULAR | Status: DC | PRN

## 2017-03-17 MED ORDER — ONDANSETRON HCL 4 MG/2ML IJ SOLN: 4.0000 mg | Freq: Four times a day (QID) | INTRAMUSCULAR | Status: DC | PRN

## 2017-03-17 MED ORDER — LACTATED RINGERS IV SOLN
INTRAVENOUS | Status: DC
  Administered 2017-03-17: 16:00:00 via INTRAVENOUS

## 2017-03-17 MED ORDER — PHENYLEPHRINE 40 MCG/ML (10ML) SYRINGE FOR IV PUSH (FOR BLOOD PRESSURE SUPPORT)
80.0000 ug | PREFILLED_SYRINGE | INTRAVENOUS | Status: DC | PRN
  Filled 2017-03-17: qty 5

## 2017-03-17 MED ORDER — OXYTOCIN 40 UNITS IN LACTATED RINGERS INFUSION - SIMPLE MED
2.5000 [IU]/h | INTRAVENOUS | Status: DC
  Filled 2017-03-17: qty 1000

## 2017-03-17 MED ORDER — LACTATED RINGERS IV SOLN
500.0000 mL | INTRAVENOUS | Status: DC | PRN
  Administered 2017-03-17: 500 mL via INTRAVENOUS

## 2017-03-17 MED ORDER — DIPHENHYDRAMINE HCL 50 MG/ML IJ SOLN: 12.5000 mg | INTRAMUSCULAR | Status: DC | PRN

## 2017-03-17 NOTE — Anesthesia Procedure Notes (Signed)
Epidural Patient location during procedure: OB Start time: 03/17/2017 2:34 PM End time: 03/17/2017 2:37 PM  Staffing Anesthesiologist: Leilani Able Performed: anesthesiologist   Preanesthetic Checklist Completed: patient identified, surgical consent, pre-op evaluation, timeout performed, IV checked, risks and benefits discussed and monitors and equipment checked  Epidural Patient position: sitting Prep: site prepped and draped and DuraPrep Patient monitoring: continuous pulse ox and blood pressure Approach: midline Location: L3-L4 Injection technique: LOR air  Needle:  Needle type: Tuohy  Needle gauge: 17 G Needle length: 9 cm and 9 Needle insertion depth: 6 cm Catheter type: closed end flexible Catheter size: 19 Gauge Catheter at skin depth: 11 cm Test dose: negative and Other  Assessment Sensory level: T9 Events: blood not aspirated, injection not painful, no injection resistance, negative IV test and no paresthesia  Additional Notes Reason for block:procedure for pain

## 2017-03-17 NOTE — Progress Notes (Signed)
Patient ID: Jessica Riddle, female   DOB: 1996-05-18, 21 y.o.   MRN: [id number]  Comfortable w/ epidural  BP 120/69, other VSS FHR 130-140s, +accels, no decels Ctx q 2-3 mins, spont Cx 7+/90/-2  IUP@term  Active labor GBS neg  Check cx in 2 hrs or sooner prn Anticipate SVD  Cam Hai CNM 03/17/2017 10:06 PM

## 2017-03-17 NOTE — MAU Note (Signed)
Urine in lab 

## 2017-03-17 NOTE — MAU Note (Signed)
Pt reports a watery discharge that started at 0500 on Sunday morning and lasted for one hour.  She had no further leaking all day, just brown mucous for the rest of the day. States an active baby and irregular contractions for the past two hours.

## 2017-03-17 NOTE — Anesthesia Preprocedure Evaluation (Signed)
Anesthesia Evaluation  Patient identified by MRN, date of birth, ID band Patient awake    Reviewed: Allergy & Precautions, H&P , NPO status , Patient's Chart, lab work & pertinent test results  Airway Mallampati: II  TM Distance: >3 FB Neck ROM: full    Dental no notable dental hx. (+) Teeth Intact   Pulmonary neg pulmonary ROS,    Pulmonary exam normal breath sounds clear to auscultation       Cardiovascular negative cardio ROS Normal cardiovascular exam Rhythm:regular Rate:Normal     Neuro/Psych negative neurological ROS  negative psych ROS   GI/Hepatic negative GI ROS, Neg liver ROS,   Endo/Other  negative endocrine ROS  Renal/GU negative Renal ROS  negative genitourinary   Musculoskeletal negative musculoskeletal ROS (+)   Abdominal (+) + obese,   Peds  Hematology negative hematology ROS (+)   Anesthesia Other Findings   Reproductive/Obstetrics (+) Pregnancy                             Anesthesia Physical Anesthesia Plan  ASA: II  Anesthesia Plan: Epidural   Post-op Pain Management:    Induction:   PONV Risk Score and Plan:   Airway Management Planned:   Additional Equipment:   Intra-op Plan:   Post-operative Plan:   Informed Consent: I have reviewed the patients History and Physical, chart, labs and discussed the procedure including the risks, benefits and alternatives for the proposed anesthesia with the patient or authorized representative who has indicated his/her understanding and acceptance.       Plan Discussed with:   Anesthesia Plan Comments:         Anesthesia Quick Evaluation  

## 2017-03-17 NOTE — H&P (Signed)
Obstetric History and Physical  Jessica Riddle is a 21 y.o. G2P1001 with IUP at [redacted]w[redacted]d presenting for SOL. Patient states she has been having  regular, every 4-5 minutes contractions, minimal vaginal bleeding, intact membranes, with active fetal movement.    Prenatal Course Source of Care: HD  with onset of care at 11.5 weeks Dating: By LMP --->  Estimated Date of Delivery: 03/12/17 Pregnancy complications or risks: Patient Active Problem List   Diagnosis Date Noted  . Labor and delivery, indication for care 03/17/2017   She plans to breastfeed She desires Nexplanon for postpartum contraception.   Prenatal labs and studies: ABO, Rh: O/Positive/-- (02/18 0000) Antibody: Negative (02/18 0000) Rubella: Immune (02/18 0000) RPR: Nonreactive (02/18 0000)  HBsAg: Negative (02/18 0000)  HIV: Non-reactive (02/18 0000)  ZOX:WRUEAVWU (07/17 0000) 1 hr Glucola  nml Genetic screening normal Anatomy US normal  Prenatal Transfer Tool  Maternal Diabetes: No Genetic Screening: Normal Maternal Ultrasounds/Referrals: Normal Fetal Ultrasounds or other Referrals:  None Maternal Substance Abuse:  No Significant Maternal Medications:  None Significant Maternal Lab Results: Lab values include: Group B Strep negative  Past Medical History:  Diagnosis Date  . Gall stones   . Medical history non-contributory     Past Surgical History:  Procedure Laterality Date  . NO PAST SURGERIES      OB History  Gravida Para Term Preterm AB Living  2 1 1  0 0 1  SAB TAB Ectopic Multiple Live Births  0 0 0 0 1    # Outcome Date GA Lbr Len/2nd Weight Sex Delivery Anes PTL Lv  2 Current           1 Term 11/23/15 104w1d 08:05 / 03:08 7 lb 5.3 oz (3.325 kg) F Vag-Spont EPI  LIV     Birth Comments: mongolian spots to sacrum      Social History   Social History  . Marital status: Single    Spouse name: N/A  . Number of children: N/A  . Years of education: N/A   Social History Main Topics  .  Smoking status: Never Smoker  . Smokeless tobacco: Never Used  . Alcohol use No  . Drug use: No  . Sexual activity: Yes    Birth control/ protection: None   Other Topics Concern  . None   Social History Narrative  . None    Family History  Problem Relation Age of Onset  . Diabetes Maternal Grandmother     Prescriptions Prior to Admission  Medication Sig Dispense Refill Last Dose  . acetaminophen (TYLENOL) 500 MG tablet Take 1,000 mg by mouth every 6 (six) hours as needed.   More than a month at Unknown time  . ferrous sulfate 325 (65 FE) MG tablet Take 1 tablet (325 mg total) by mouth 2 (two) times daily with a meal. (Patient not taking: Reported on 09/05/2016) 60 tablet 3 More than a month at Unknown time  . ibuprofen (ADVIL,MOTRIN) 600 MG tablet Take 1 tablet (600 mg total) by mouth every 6 (six) hours. (Patient not taking: Reported on 09/05/2016) 30 tablet 0 More than a month at Unknown time  . Prenatal Vit-Fe Fumarate-FA (MULTIVITAMIN-PRENATAL) 27-0.8 MG TABS tablet Take 1 tablet by mouth daily at 12 noon.   03/16/2017 at Unknown time    No Known Allergies  Review of Systems: Negative except for what is mentioned in HPI.  Physical Exam: BP (!) 109/59   Pulse 81   Temp 97.6 F (36.4 C) (Oral)  Resp 18   Ht 5\' 4"  (1.626 m)   Wt 205 lb (93 kg)   LMP 06/05/2016   SpO2 100%   BMI 35.19 kg/m  CONSTITUTIONAL: Well-developed, well-nourished female in no acute distress.  HENT:  Normocephalic, atraumatic, External right and left ear normal. Oropharynx is clear and moist EYES: Conjunctivae and EOM are normal. Pupils are equal, round, and reactive to light. No scleral icterus.  NECK: Normal range of motion, supple, no masses SKIN: Skin is warm and dry. No rash noted. Not diaphoretic. No erythema. No pallor. NEUROLOGIC: Alert and oriented to person, place, and time. Normal reflexes, muscle tone coordination. No cranial nerve deficit noted. PSYCHIATRIC: Normal mood and affect.  Normal behavior. Normal judgment and thought content. CARDIOVASCULAR: Normal heart rate noted, regular rhythm RESPIRATORY: Effort and breath sounds normal, no problems with respiration noted ABDOMEN: Soft, nontender, nondistended, gravid. MUSCULOSKELETAL: Normal range of motion. No edema and no tenderness. 2+ distal pulses. Cervical exam: Dilation: 4.5 Effacement (%): 70 Cervical Position: Posterior Station: -2 Presentation: Vertex Exam by:: Dr. Doroteo Glassman  Presentation: cephalic FHT:  Baseline rate 140 bpm   Variability moderate  Accelerations present   Decelerations none Contractions: Every 2-3 mins   Pertinent Labs/Studies:   Results for orders placed or performed during the hospital encounter of 03/17/17 (from the past 24 hour(s))  CBC     Status: Abnormal   Collection Time: 03/17/17  1:30 PM  Result Value Ref Range   WBC 13.5 (H) 4.0 - 10.5 K/uL   RBC 4.04 3.87 - 5.11 MIL/uL   Hemoglobin 10.5 (L) 12.0 - 15.0 g/dL   HCT 88.7 (L) 57.9 - 72.8 %   MCV 80.2 78.0 - 100.0 fL   MCH 26.0 26.0 - 34.0 pg   MCHC 32.4 30.0 - 36.0 g/dL   RDW 20.6 (H) 01.5 - 61.5 %   Platelets 257 150 - 400 K/uL    Assessment : Jessica Riddle is a 21 y.o. G2P1001 at [redacted]w[redacted]d being admitted for SOL. She is is early active labor.   Plan: Labor: Expectant management. Augmentation as needed per protocol.  Epidural for analgesia FWB: Reassuring fetal heart tracing.   GBS negative Delivery plan: Hopeful for vaginal delivery  Caryl Ada, DO OB Fellow Faculty Practice, [facility] 03/17/2017, 3:35 PM

## 2017-03-17 NOTE — Progress Notes (Signed)
Dr Doroteo Glassman Notified orders received for admission

## 2017-03-17 NOTE — MAU Note (Signed)
Pt presents to MAU after being evaluated at the health dept today for labor check, VE 3cms by provider. Denies any VB or LOF

## 2017-03-17 NOTE — Anesthesia Pain Management Evaluation Note (Signed)
  CRNA Pain Management Visit Note  Patient: Jessica Riddle, 21 y.o., female  "Hello I am a member of the anesthesia team at [facility]. We have an anesthesia team available at all times to provide care throughout the hospital, including epidural management and anesthesia for C-section. I don't know your plan for the delivery whether it a natural birth, water birth, IV sedation, nitrous supplementation, doula or epidural, but we want to meet your pain goals."   1.Was your pain managed to your expectations on prior hospitalizations?   Yes   2.What is your expectation for pain management during this hospitalization?     Epidural  3.How can we help you reach that goal?   Record the patient's initial score and the patient's pain goal.   Pain: 3  Pain Goal: 6 [facility] wants you to be able to say your pain was always managed very well.  Laban Emperor 03/17/2017

## 2017-03-17 NOTE — Progress Notes (Signed)
Labor Progress Note  Jessica Riddle is a 21 y.o. G2P1001 at [redacted]w[redacted]d  admitted for SOL  S: Comfortable with epidural. No concerns.  O:  BP 120/73   Pulse 96   Temp 98 F (36.7 C) (Oral)   Resp 20   Ht 5\' 4"  (1.626 m)   Wt 205 lb (93 kg)   LMP 06/05/2016   SpO2 100%   BMI 35.19 kg/m   No intake/output data recorded.  FHT:  FHR: 140 bpm, variability: moderate,  accelerations:  Present,  decelerations:  Present Variable UC:   regular, every 2-3 minutes SVE:   Dilation: 5.5 Effacement (%): 80 Station: -1 Exam by:: Valentina Lucks, RN  Labs: Lab Results  Component Value Date   WBC 13.5 (H) 03/17/2017   HGB 10.5 (L) 03/17/2017   HCT 32.4 (L) 03/17/2017   MCV 80.2 03/17/2017   PLT 257 03/17/2017    Assessment / Plan: 21 y.o. G2P1001 [redacted]w[redacted]d in early active labor Spontaneous labor, progressing normally  Labor: Progressing normally, will AROM  Fetal Wellbeing:  Category I Pain Control:  Epidural Anticipated MOD:  NSVD  Expectant management   Caryl Ada, DO OB Fellow Faculty Practice, [facility] 03/17/2017, 7:01 PM

## 2017-03-17 NOTE — Discharge Instructions (Signed)

## 2017-03-18 DIAGNOSIS — Z3A40 40 weeks gestation of pregnancy: Secondary | ICD-10-CM

## 2017-03-18 LAB — RPR: RPR: NONREACTIVE

## 2017-03-18 MED ORDER — ZOLPIDEM TARTRATE 5 MG PO TABS: 5.0000 mg | ORAL_TABLET | Freq: Every evening | ORAL | Status: DC | PRN

## 2017-03-18 MED ORDER — COCONUT OIL OIL
1.0000 "application " | TOPICAL_OIL | Status: DC | PRN
  Filled 2017-03-18: qty 120

## 2017-03-18 MED ORDER — DIPHENHYDRAMINE HCL 25 MG PO CAPS: 25.0000 mg | ORAL_CAPSULE | Freq: Four times a day (QID) | ORAL | Status: DC | PRN

## 2017-03-18 MED ORDER — ACETAMINOPHEN 325 MG PO TABS
650.0000 mg | ORAL_TABLET | ORAL | Status: DC | PRN
  Administered 2017-03-18 (×2): 650 mg via ORAL

## 2017-03-18 MED ORDER — OXYCODONE HCL 5 MG PO TABS: 5.0000 mg | ORAL_TABLET | ORAL | Status: DC | PRN

## 2017-03-18 MED ORDER — SIMETHICONE 80 MG PO CHEW: 80.0000 mg | CHEWABLE_TABLET | ORAL | Status: DC | PRN

## 2017-03-18 MED ORDER — DIBUCAINE 1 % RE OINT: 1.0000 "application " | TOPICAL_OINTMENT | RECTAL | Status: DC | PRN

## 2017-03-18 MED ORDER — IBUPROFEN 600 MG PO TABS
600.0000 mg | ORAL_TABLET | Freq: Four times a day (QID) | ORAL | Status: DC
  Administered 2017-03-18 – 2017-03-19 (×6): 600 mg via ORAL
  Filled 2017-03-18 (×6): qty 1

## 2017-03-18 MED ORDER — PRENATAL MULTIVITAMIN CH
1.0000 | ORAL_TABLET | Freq: Every day | ORAL | Status: DC
  Administered 2017-03-18 – 2017-03-19 (×2): 1 via ORAL
  Filled 2017-03-18 (×2): qty 1

## 2017-03-18 MED ORDER — TETANUS-DIPHTH-ACELL PERTUSSIS 5-2.5-18.5 LF-MCG/0.5 IM SUSP: 0.5000 mL | Freq: Once | INTRAMUSCULAR | Status: DC

## 2017-03-18 MED ORDER — BENZOCAINE-MENTHOL 20-0.5 % EX AERO: 1.0000 "application " | INHALATION_SPRAY | CUTANEOUS | Status: DC | PRN

## 2017-03-18 MED ORDER — SENNOSIDES-DOCUSATE SODIUM 8.6-50 MG PO TABS
2.0000 | ORAL_TABLET | ORAL | Status: DC
  Administered 2017-03-19: 2 via ORAL
  Filled 2017-03-18: qty 2

## 2017-03-18 MED ORDER — WITCH HAZEL-GLYCERIN EX PADS: 1.0000 "application " | MEDICATED_PAD | CUTANEOUS | Status: DC | PRN

## 2017-03-18 MED ORDER — ONDANSETRON HCL 4 MG PO TABS: 4.0000 mg | ORAL_TABLET | ORAL | Status: DC | PRN

## 2017-03-18 MED ORDER — ONDANSETRON HCL 4 MG/2ML IJ SOLN: 4.0000 mg | INTRAMUSCULAR | Status: DC | PRN

## 2017-03-18 NOTE — Plan of Care (Signed)
Problem: Role Relationship: Goal: Ability to demonstrate positive interaction with newborn will improve Outcome: Progressing Patient is bonding well with newborn.

## 2017-03-18 NOTE — Lactation Note (Signed)
This note was copied from a baby's chart. Lactation Consultation Note  Patient Name: Jessica Riddle OOILN'Z Date: 03/18/2017 Reason for consult: Initial assessment Baby at 9 hr of life. Upon entry mom was in the bed with baby and dad was sleeping in the couch. Mom stated she was not sleeping, "just laying her watching tv". Reviewed safe sleep. Mom reports baby is latching well. She stated "some times she just gets the nipple so I take her off and try again". She stated she can manually express and has seen colostrum bilaterally. There is a spoon at the bedside. Discussed baby behavior, feeding frequency, baby belly size, voids, wt loss, breast changes, and nipple care. Given lactation handouts. Aware of OP services and support group. Mom will offer the breast on demand 8+/24hr. She will call for staff to view a latch in the next 8 hr.     Maternal Data Has patient been taught Hand Expression?: Yes Does the patient have breastfeeding experience prior to this delivery?: Yes  Feeding Feeding Type: Breast Fed Length of feed: 10 min  LATCH Score                   Interventions    Lactation Tools Discussed/Used WIC Program: Yes   Consult Status Consult Status: Follow-up Date: 03/19/17 Follow-up type: In-patient    Jessica Riddle 03/18/2017, 11:56 AM

## 2017-03-18 NOTE — Plan of Care (Signed)
Problem: Bowel/Gastric: Goal: Gastrointestinal status will improve Outcome: Completed/Met Date Met: 03/18/17 Patient stated that she has not had a bowel movement today, but had one yesterday.

## 2017-03-18 NOTE — Plan of Care (Signed)
Problem: Pain Management: Goal: General experience of comfort will improve and pain level will decrease Outcome: Progressing Patient verbalized pain of 5. Scheduled pain and prn medications reviewed with patient.

## 2017-03-18 NOTE — Lactation Note (Signed)
This note was copied from a baby's chart. Lactation Consultation Note  Patient Name: Jessica Riddle Date: 03/18/2017 Reason for consult: Follow-up assessment   Mom called out for latch help. Mom reports infant is just latching on to the nipple. Assisted mom latching infant to the left breast using the cross cradle. Assisted mom with deepening latch and mom reports that the latch fel much better. Enc mom to massage breast with feeding, mom did so with feeding. Infant was still feeding when LC left room. Enc mom to call out for further feeding assistance as needed.    Maternal Data Has patient been taught Hand Expression?: Yes Does the patient have breastfeeding experience prior to this delivery?: Yes  Feeding Feeding Type: Breast Fed Length of feed: 5 min (still feeding when LC left room)  LATCH Score Latch: Grasps breast easily, tongue down, lips flanged, rhythmical sucking.  Audible Swallowing: A few with stimulation  Type of Nipple: Everted at rest and after stimulation  Comfort (Breast/Nipple): Soft / non-tender  Hold (Positioning): Assistance needed to correctly position infant at breast and maintain latch.  LATCH Score: 8  Interventions Interventions: Breast feeding basics reviewed;Support pillows;Assisted with latch  Lactation Tools Discussed/Used     Consult Status Consult Status: Follow-up Date: 03/12/17 Follow-up type: In-patient    Jessica Riddle Jessica Riddle 03/18/2017, 4:27 PM

## 2017-03-18 NOTE — Plan of Care (Signed)
Problem: Role Relationship: Goal: Ability to demonstrate positive interaction with newborn will improve Outcome: Completed/Met Date Met: 03/18/17 Patient demonstrates a positive interaction with newborn.

## 2017-03-19 ENCOUNTER — Inpatient Hospital Stay (HOSPITAL_COMMUNITY): Admission: RE | Admit: 2017-03-19 | Payer: Medicaid Other | Source: Ambulatory Visit

## 2017-03-19 ENCOUNTER — Encounter (HOSPITAL_COMMUNITY): Payer: Self-pay | Admitting: *Deleted

## 2017-03-19 LAB — BIRTH TISSUE RECOVERY COLLECTION (PLACENTA DONATION)

## 2017-03-19 MED ORDER — IBUPROFEN 600 MG PO TABS: 600.0000 mg | ORAL_TABLET | Freq: Four times a day (QID) | ORAL | 0 refills | Status: DC

## 2017-03-19 NOTE — Lactation Note (Signed)
This note was copied from a baby's chart. Lactation Consultation Note  Patient Name: Jessica Riddle [id number] Date: 03/19/2017 Reason for consult: Follow-up assessment  Follow up visit at 34 hours of age.  Mom reports she is awaiting discharge today.  Mom denies concerns at this time and reports breastfeeding is going well. Discussed milk transitioning to larger volume, engorgement care discussed.  Encouraged frequent feedings. Mom to soften breast as needed prior to latch.      Maternal Data    Feeding Feeding Type: Breast Fed  LATCH Score Latch: Grasps breast easily, tongue down, lips flanged, rhythmical sucking.  Audible Swallowing: Spontaneous and intermittent  Type of Nipple: Everted at rest and after stimulation  Comfort (Breast/Nipple): Soft / non-tender  Hold (Positioning): Assistance needed to correctly position infant at breast and maintain latch.  LATCH Score: 9  Interventions    Lactation Tools Discussed/Used     Consult Status Consult Status: Complete Date: 03/19/17    Franz Dell 03/19/2017, 12:41 PM

## 2017-03-19 NOTE — Discharge Instructions (Signed)

## 2017-03-19 NOTE — Anesthesia Postprocedure Evaluation (Signed)
Anesthesia Post Note  Patient: Jessica Riddle  Procedure(s) Performed: * No procedures listed *     Patient location during evaluation: Mother Baby Anesthesia Type: Epidural Level of consciousness: awake and alert Pain management: pain level controlled Vital Signs Assessment: post-procedure vital signs reviewed and stable Respiratory status: spontaneous breathing, nonlabored ventilation and respiratory function stable Cardiovascular status: stable Postop Assessment: no headache, no backache and epidural receding Anesthetic complications: no    Last Vitals:  Vitals:   03/18/17 1854 03/19/17 0541  BP: (!) 111/59 122/71  Pulse: 82 70  Resp: 18 16  Temp: 37.2 C 36.8 C  SpO2: 100%     Last Pain:  Vitals:   03/19/17 0705  TempSrc:   PainSc: Asleep                 Edwin Cherian S

## 2017-03-19 NOTE — Discharge Summary (Signed)
Obstetric Discharge Summary Reason for Admission: onset of labor Prenatal Procedures: ultrasound Intrapartum Procedures: spontaneous vaginal delivery Postpartum Procedures: none Complications-Operative and Postpartum: none  Delivery Note Pt became complete at MN and labored down until approx 0150. She pushed well and at 2:02 AM a viable female was delivered via Vaginal, Spontaneous Delivery (Presentation: OA).  APGAR: 8,9 ; weight: pending . Nuchal cord x 1 reduced prior to delivery. Infant dried and placed on pt's abd; cord clamped and cut by FOB; hospital cord blood sample collected. Placenta status: spont, intact .  Cord: 3 vessel  Anesthesia: Epidural  Episiotomy: None Lacerations: None Est. Blood Loss (mL): 250  Mom to postpartum.  Baby to Couplet care / Skin to Skin.  Hospital Course:  Active Problems:   Labor and delivery, indication for care   SVD (spontaneous vaginal delivery)   Jessica Riddle is a 21 y.o. G2P1001 s/p SVD.  Patient was admitted for SOL on 03/17/17.  She has postpartum course that was uncomplicated including no problems with ambulating, PO intake, urination, pain, or bleeding. The pt feels ready to go home and will be discharged with outpatient follow-up at the HD in 4 weeks. She states that she will receive nexplanon from HD at that visit. Pt instructed to abstain from sex until follow up appointment and verbalized agreement. Precautions given regarding bleeding, pain, DVT, post-partum depression.   Today: No acute events overnight.  Pt denies problems with ambulating, voiding or po intake.  She denies nausea or vomiting.  Pain is well controlled.  She has had flatus. She has not had bowel movement.  Lochia Small.  Plan for birth control is  Nexplanon.  Method of Feeding: Breast.   Physical Exam:  General: alert, cooperative and no distress Lochia: appropriate Uterine Fundus: firm Incision: N/A DVT Evaluation: no signs of DVT  H/H: Lab Results   Component Value Date/Time   HGB 10.5 (L) 03/17/2017 01:30 PM   HCT 32.4 (L) 03/17/2017 01:30 PM    Discharge Diagnoses: Term Pregnancy-delivered  Discharge Information: Date: 03/19/2017 Activity: pelvic rest Diet: routine  Medications: Ibuprofen Breast feeding:  Yes Condition: stable Instructions: refer to handout Discharge to: home   Allergies as of 03/19/2017   No Known Allergies     Medication List    TAKE these medications   acetaminophen 500 MG tablet Commonly known as:  TYLENOL Take 500 mg by mouth every 6 (six) hours as needed for mild pain or headache.   ibuprofen 600 MG tablet Commonly known as:  ADVIL,MOTRIN Take 1 tablet (600 mg total) by mouth every 6 (six) hours.   multivitamin-prenatal 27-0.8 MG Tabs tablet Take 1 tablet by mouth daily at 12 noon.            Discharge Care Instructions        Start     Ordered   03/19/17 0000  ibuprofen (ADVIL,MOTRIN) 600 MG tablet  Every 6 hours     03/19/17 1049      Dannette Barbara, Medical Student 03/19/2017,9:06 AM  OB FELLOW DISCHARGE ATTESTATION  I have seen and examined this patient. I agree with above documentation and have made edits as needed.   Caryl Ada, DO OB Fellow 10:52 AM

## 2017-06-01 ENCOUNTER — Encounter (HOSPITAL_COMMUNITY): Payer: Self-pay | Admitting: *Deleted

## 2017-06-01 ENCOUNTER — Other Ambulatory Visit: Payer: Self-pay

## 2017-06-01 ENCOUNTER — Emergency Department (HOSPITAL_COMMUNITY)
Admission: EM | Admit: 2017-06-01 | Discharge: 2017-06-01 | Disposition: A | Discharge status: Home / Self Care | Payer: Self-pay | Attending: Emergency Medicine | Admitting: Emergency Medicine

## 2017-06-01 DIAGNOSIS — K8050 Calculus of bile duct without cholangitis or cholecystitis without obstruction: Secondary | ICD-10-CM | POA: Insufficient documentation

## 2017-06-01 DIAGNOSIS — K8020 Calculus of gallbladder without cholecystitis without obstruction: Secondary | ICD-10-CM | POA: Insufficient documentation

## 2017-06-01 LAB — CBC
HCT: 34.6 % — ABNORMAL LOW (ref 36.0–46.0)
Hemoglobin: 11 g/dL — ABNORMAL LOW (ref 12.0–15.0)
MCH: 25.3 pg — AB (ref 26.0–34.0)
MCHC: 31.8 g/dL (ref 30.0–36.0)
MCV: 79.7 fL (ref 78.0–100.0)
PLATELETS: 344 10*3/uL (ref 150–400)
RBC: 4.34 MIL/uL (ref 3.87–5.11)
RDW: 15.6 % — AB (ref 11.5–15.5)
WBC: 12.6 10*3/uL — ABNORMAL HIGH (ref 4.0–10.5)

## 2017-06-01 LAB — LIPASE, BLOOD: Lipase: 26 U/L (ref 11–51)

## 2017-06-01 LAB — COMPREHENSIVE METABOLIC PANEL
ALK PHOS: 99 U/L (ref 38–126)
ALT: 48 U/L (ref 14–54)
AST: 43 U/L — ABNORMAL HIGH (ref 15–41)
Albumin: 4 g/dL (ref 3.5–5.0)
Anion gap: 6 (ref 5–15)
BUN: 11 mg/dL (ref 6–20)
CALCIUM: 9.4 mg/dL (ref 8.9–10.3)
CO2: 28 mmol/L (ref 22–32)
CREATININE: 0.51 mg/dL (ref 0.44–1.00)
Chloride: 107 mmol/L (ref 101–111)
GFR calc non Af Amer: >60 mL/min (ref >60)
GLUCOSE: 95 mg/dL (ref 65–99)
Potassium: 3.8 mmol/L (ref 3.5–5.1)
SODIUM: 141 mmol/L (ref 135–145)
Total Bilirubin: 0.4 mg/dL (ref 0.3–1.2)
Total Protein: 7.7 g/dL (ref 6.5–8.1)

## 2017-06-01 MED ORDER — IBUPROFEN 200 MG PO TABS
400.0000 mg | ORAL_TABLET | Freq: Once | ORAL | Status: AC
  Administered 2017-06-01: 400 mg via ORAL
  Filled 2017-06-01: qty 2

## 2017-06-01 MED ORDER — ONDANSETRON 8 MG PO TBDP: 8.0000 mg | ORAL_TABLET | Freq: Three times a day (TID) | ORAL | 0 refills | Status: DC | PRN

## 2017-06-01 MED ORDER — OXYCODONE-ACETAMINOPHEN 5-325 MG PO TABS
1.0000 | ORAL_TABLET | Freq: Once | ORAL | Status: AC
  Administered 2017-06-01: 1 via ORAL
  Filled 2017-06-01: qty 1

## 2017-06-01 MED ORDER — ONDANSETRON 8 MG PO TBDP
8.0000 mg | ORAL_TABLET | Freq: Once | ORAL | Status: AC
  Administered 2017-06-01: 8 mg via ORAL
  Filled 2017-06-01: qty 1

## 2017-06-01 MED ORDER — HYDROCODONE-ACETAMINOPHEN 5-325 MG PO TABS: 1.0000 | ORAL_TABLET | Freq: Four times a day (QID) | ORAL | 0 refills | Status: DC | PRN

## 2017-06-01 NOTE — ED Triage Notes (Signed)
Pt dx with gall stone in May during pregnancy, today pain has returned. Nausea but no vomiting.

## 2017-06-01 NOTE — Discharge Instructions (Addendum)
It was our pleasure to provide your ER care today - we hope that you feel better.  For gallstones, follow up with general surgeon in the next 1-2 weeks - see referral - call office tomorrow to arrange appointment.  Take motrin or aleve as need for pain. You may also take hydrocodone as need for pain. No driving for the next 6 hours or when taking hydrocodone. Also, do not take tylenol or acetaminophen containing medication when taking hydrocodone.  Also, do not breast feed if/when taking the hydrocodone.    If you need to take the hydrocodone pain medication at home, then use previously pumped mild or formula.     You may take zofran as need for nausea.  Return to ER if worse, new symptoms, fevers, persistent vomiting, severe or intractable pain, other concern.

## 2017-06-01 NOTE — ED Provider Notes (Signed)
[facility]-EMERGENCY DEPT Provider Note   CSN: [id number] Arrival date & time: 06/01/17  1721     History   Chief Complaint Chief Complaint  Patient presents with  . Abdominal Pain    HPI Jessica Riddle is a 21 y.o. female.  Patient c/o ruq abd pain this evening. Pain mod, dull, persistent. Hx same pain from gallstones in past. States originally dx during pregnancy last spring, and that she had another visit to ED in [place] last month. No previous general surgery follow up. Pain is worse w certain foods, esp meats. Denies vomiting. No diarrhea. No abd distension. No prior abd surgery. Denies fever or chills. Pain occasionally radiates towards back.    The history is provided by the patient.  Abdominal Pain   Pertinent negatives include fever, diarrhea, vomiting, dysuria and headaches.    Past Medical History:  Diagnosis Date  . Gall stones   . Medical history non-contributory     Patient Active Problem List   Diagnosis Date Noted  . SVD (spontaneous vaginal delivery) 03/19/2017  . Labor and delivery, indication for care 03/17/2017    Past Surgical History:  Procedure Laterality Date  . NO PAST SURGERIES      OB History    Gravida Para Term Preterm AB Living   2 1 1  0 0 1   SAB TAB Ectopic Multiple Live Births   0 0 0 0 1       Home Medications    Prior to Admission medications   Medication Sig Start Date End Date Taking? Authorizing Provider  acetaminophen (TYLENOL) 500 MG tablet Take 500 mg by mouth every 6 (six) hours as needed for mild pain or headache.     [provider]  ibuprofen (ADVIL,MOTRIN) 600 MG tablet Take 1 tablet (600 mg total) by mouth every 6 (six) hours. 03/19/17   Pincus Large, DO  Prenatal Vit-Fe Fumarate-FA (MULTIVITAMIN-PRENATAL) 27-0.8 MG TABS tablet Take 1 tablet by mouth daily at 12 noon.    [provider]    Family History Family History  Problem Relation Age of Onset  . Diabetes  Maternal Grandmother     Social History Social History   Tobacco Use  . Smoking status: Never Smoker  . Smokeless tobacco: Never Used  Substance Use Topics  . Alcohol use: No  . Drug use: No     Allergies   Patient has no known allergies.   Review of Systems Review of Systems  Constitutional: Negative for fever.  HENT: Negative for sore throat.   Eyes: Negative for redness.  Respiratory: Negative for shortness of breath.   Cardiovascular: Negative for chest pain.  Gastrointestinal: Positive for abdominal pain. Negative for diarrhea and vomiting.  Genitourinary: Negative for dysuria, vaginal bleeding and vaginal discharge.  Musculoskeletal: Negative for neck pain.  Skin: Negative for rash.  Neurological: Negative for headaches.  Hematological: Does not bruise/bleed easily.  Psychiatric/Behavioral: Negative for confusion.     Physical Exam Updated Vital Signs BP 122/63 (BP Location: Left Arm)   Pulse 67   Temp 98.7 F (37.1 C) (Oral)   Resp 15   Ht 1.626 m (5\' 4" )   Wt 88.1 kg (194 lb 2 oz)   SpO2 100%   BMI 33.32 kg/m   Physical Exam  Constitutional: She appears well-developed and well-nourished. No distress.  HENT:  Head: Atraumatic.  Eyes: Conjunctivae are normal. No scleral icterus.  Neck: Neck supple. No tracheal deviation present.  Cardiovascular: Normal  rate.  Pulmonary/Chest: Effort normal. No respiratory distress.  Abdominal: Soft. Normal appearance and bowel sounds are normal. She exhibits no distension. There is no hepatosplenomegaly. There is tenderness in the right upper quadrant. There is no rigidity, no rebound, no guarding, no CVA tenderness and negative Murphy's sign.  Genitourinary:  Genitourinary Comments: No cva tenderness  Musculoskeletal: She exhibits no edema.  Neurological: She is alert.  Skin: Skin is warm and dry. No rash noted.  Psychiatric: She has a normal mood and affect.  Nursing note and vitals reviewed.    ED  Treatments / Results  Labs (all labs ordered are listed, but only abnormal results are displayed) Results for orders placed or performed during the hospital encounter of 06/01/17  Lipase, blood  Result Value Ref Range   Lipase 26 11 - 51 U/L  Comprehensive metabolic panel  Result Value Ref Range   Sodium 141 135 - 145 mmol/L   Potassium 3.8 3.5 - 5.1 mmol/L   Chloride 107 101 - 111 mmol/L   CO2 28 22 - 32 mmol/L   Glucose, Bld 95 65 - 99 mg/dL   BUN 11 6 - 20 mg/dL   Creatinine, Ser 0.450.51 0.44 - 1.00 mg/dL   Calcium 9.4 8.9 - 40.910.3 mg/dL   Total Protein 7.7 6.5 - 8.1 g/dL   Albumin 4.0 3.5 - 5.0 g/dL   AST 43 (H) 15 - 41 U/L   ALT 48 14 - 54 U/L   Alkaline Phosphatase 99 38 - 126 U/L   Total Bilirubin 0.4 0.3 - 1.2 mg/dL   GFR calc non Af Amer >60 >60 mL/min   GFR calc Af Amer >60 >60 mL/min   Anion gap 6 5 - 15  CBC  Result Value Ref Range   WBC 12.6 (H) 4.0 - 10.5 K/uL   RBC 4.34 3.87 - 5.11 MIL/uL   Hemoglobin 11.0 (L) 12.0 - 15.0 g/dL   HCT 81.134.6 (L) 91.436.0 - 78.246.0 %   MCV 79.7 78.0 - 100.0 fL   MCH 25.3 (L) 26.0 - 34.0 pg   MCHC 31.8 30.0 - 36.0 g/dL   RDW 95.615.6 (H) 21.311.5 - 08.615.5 %   Platelets 344 150 - 400 K/uL    EKG  EKG Interpretation None       Radiology No results found.  Procedures Procedures (including critical care time)  Medications Ordered in ED Medications - No data to display   Initial Impression / Assessment and Plan / ED Course  I have reviewed the triage vital signs and the nursing notes.  Pertinent labs & imaging results that were available during my care of the patient were reviewed by me and considered in my medical decision making (see chart for details).  Hydrocodone po. zofran po.  Po fluids.   Reviewed nursing notes and prior charts for additional history.   Recheck pain improved. abd soft nt.     Final Clinical Impressions(s) / ED Diagnoses   Final diagnoses:  None    ED Discharge Orders    None       Cathren LaineSteinl, Roneisha Stern,  MD 06/01/17 2314

## 2017-06-01 NOTE — ED Notes (Signed)
Pt asked to bring in her 2 mo old infant to breastfeed. Since we are not currently under flu restrictions for minors, the decision was hers to take the risk of the child getting sick.

## 2017-06-11 ENCOUNTER — Ambulatory Visit: Payer: Self-pay | Admitting: General Surgery

## 2018-07-29 NOTE — L&D Delivery Note (Signed)
OB/GYN Faculty Practice Delivery Note  Jessica Riddle is a 23 y.o. [id number] s/p SVD at [redacted]w[redacted]d. She was admitted for spontaneous onset of labor.   ROM: 0h 8m with clear fluid GBS Status: negative Maximum Maternal Temperature: Temp (48hrs), Avg:98.5 F (36.9 C), Min:98.2 F (36.8 C), Max:98.9 F (37.2 C)  Labor Progress: . Admitted in active labor . Epidural placement . AROM when crowning just prior to delivery  Delivery Date/Time: 12/02/18 at 0032 Delivery: Called to room and patient was complete and pushing. Head delivered LOA. Loose body cord. Shoulder and body delivered in usual fashion. Infant with spontaneous cry, placed on mother's abdomen, dried and stimulated. Cord clamped x 2 after 1-minute delay, and cut by father of baby. Cord blood drawn. Placenta delivered spontaneously with gentle cord traction. Fundus firm with massage and Pitocin. Labia, perineum, vagina, and cervix inspected inspected with no periurethral laceration.   Placenta: spontaneous, intact, 3-vessel cord (to be discarded) Complications: none immediate Lacerations: hemostatic periurethral laceration EBL: 82cc per triton Analgesia: epidural   Postpartum Planning [/] message to sent to schedule follow-up  [x]  vaccines UTD  Infant: Vigorous female  APGARs 9, 9  weight pending  Jessica Karam S. Earlene Plater, DO OB/GYN Fellow, Faculty Practice

## 2018-08-02 ENCOUNTER — Emergency Department (HOSPITAL_COMMUNITY): Payer: Self-pay

## 2018-08-02 ENCOUNTER — Encounter: Payer: Self-pay | Admitting: Emergency Medicine

## 2018-08-02 ENCOUNTER — Emergency Department (HOSPITAL_COMMUNITY)
Admission: EM | Admit: 2018-08-02 | Discharge: 2018-08-02 | Disposition: A | Discharge status: Home / Self Care | Payer: Self-pay | Attending: Emergency Medicine | Admitting: Emergency Medicine

## 2018-08-02 DIAGNOSIS — Z3A22 22 weeks gestation of pregnancy: Secondary | ICD-10-CM | POA: Insufficient documentation

## 2018-08-02 DIAGNOSIS — N83201 Unspecified ovarian cyst, right side: Secondary | ICD-10-CM | POA: Insufficient documentation

## 2018-08-02 DIAGNOSIS — R1031 Right lower quadrant pain: Secondary | ICD-10-CM | POA: Insufficient documentation

## 2018-08-02 DIAGNOSIS — N83209 Unspecified ovarian cyst, unspecified side: Secondary | ICD-10-CM

## 2018-08-02 DIAGNOSIS — N390 Urinary tract infection, site not specified: Secondary | ICD-10-CM

## 2018-08-02 DIAGNOSIS — O2342 Unspecified infection of urinary tract in pregnancy, second trimester: Secondary | ICD-10-CM | POA: Insufficient documentation

## 2018-08-02 HISTORY — DX: Unspecified ovarian cyst, unspecified side: N83.209

## 2018-08-02 LAB — CBC
HCT: 35.5 % — ABNORMAL LOW (ref 36.0–46.0)
HEMOGLOBIN: 11.4 g/dL — AB (ref 12.0–15.0)
MCH: 28.7 pg (ref 26.0–34.0)
MCHC: 32.1 g/dL (ref 30.0–36.0)
MCV: 89.4 fL (ref 80.0–100.0)
PLATELETS: 256 10*3/uL (ref 150–400)
RBC: 3.97 MIL/uL (ref 3.87–5.11)
RDW: 13.7 % (ref 11.5–15.5)
WBC: 13.6 10*3/uL — AB (ref 4.0–10.5)
nRBC: 0 % (ref 0.0–0.2)

## 2018-08-02 LAB — URINALYSIS, ROUTINE W REFLEX MICROSCOPIC
Bilirubin Urine: NEGATIVE
GLUCOSE, UA: NEGATIVE mg/dL
HGB URINE DIPSTICK: NEGATIVE
Ketones, ur: 20 mg/dL — AB
NITRITE: NEGATIVE
PH: 6 (ref 5.0–8.0)
Protein, ur: NEGATIVE mg/dL
SPECIFIC GRAVITY, URINE: 1.02 (ref 1.005–1.030)

## 2018-08-02 LAB — COMPREHENSIVE METABOLIC PANEL
ALT: 11 U/L (ref 0–44)
AST: 14 U/L — AB (ref 15–41)
Albumin: 3 g/dL — ABNORMAL LOW (ref 3.5–5.0)
Alkaline Phosphatase: 79 U/L (ref 38–126)
Anion gap: 10 (ref 5–15)
BILIRUBIN TOTAL: 0.5 mg/dL (ref 0.3–1.2)
BUN: 7 mg/dL (ref 6–20)
CALCIUM: 8.5 mg/dL — AB (ref 8.9–10.3)
CO2: 21 mmol/L — ABNORMAL LOW (ref 22–32)
Chloride: 106 mmol/L (ref 98–111)
Creatinine, Ser: 0.36 mg/dL — ABNORMAL LOW (ref 0.44–1.00)
GFR calc Af Amer: >60 mL/min (ref >60)
Glucose, Bld: 107 mg/dL — ABNORMAL HIGH (ref 70–99)
POTASSIUM: 3.9 mmol/L (ref 3.5–5.1)
Sodium: 137 mmol/L (ref 135–145)
TOTAL PROTEIN: 6.8 g/dL (ref 6.5–8.1)

## 2018-08-02 LAB — I-STAT BETA HCG BLOOD, ED (MC, WL, AP ONLY): I-stat hCG, quantitative: >2000 m[IU]/mL — ABNORMAL HIGH (ref <5)

## 2018-08-02 LAB — LIPASE, BLOOD: Lipase: 28 U/L (ref 11–51)

## 2018-08-02 MED ORDER — SODIUM CHLORIDE 0.9 % IV SOLN
1.0000 g | Freq: Once | INTRAVENOUS | Status: AC
  Administered 2018-08-02: 1 g via INTRAVENOUS
  Filled 2018-08-02: qty 10

## 2018-08-02 MED ORDER — CEPHALEXIN 500 MG PO CAPS: 500.0000 mg | ORAL_CAPSULE | Freq: Four times a day (QID) | ORAL | 0 refills | Status: DC

## 2018-08-02 MED ORDER — FENTANYL CITRATE (PF) 100 MCG/2ML IJ SOLN: 50.0000 ug | Freq: Once | INTRAMUSCULAR | Status: DC

## 2018-08-02 MED ORDER — MORPHINE SULFATE (PF) 4 MG/ML IV SOLN
4.0000 mg | Freq: Once | INTRAVENOUS | Status: AC
  Administered 2018-08-02: 4 mg via INTRAVENOUS
  Filled 2018-08-02: qty 1

## 2018-08-02 MED ORDER — SODIUM CHLORIDE 0.9 % IV BOLUS
1000.0000 mL | Freq: Once | INTRAVENOUS | Status: AC
  Administered 2018-08-02: 1000 mL via INTRAVENOUS

## 2018-08-02 NOTE — ED Triage Notes (Signed)
Fetal HR found on doppler at 148.

## 2018-08-02 NOTE — ED Provider Notes (Addendum)
  Physical Exam  BP 120/79 (BP Location: Right Arm)   Pulse (!) 102   Temp 98.1 F (36.7 C) (Oral)   Resp 17   Ht 5\' 3"  (1.6 m)   Wt 90.7 kg   SpO2 100%   BMI 35.43 kg/m   Physical Exam  ED Course/Procedures     Procedures  MDM  Care assumed at 4 pm from Dr. Madilyn Hook.  Patient has been having right-sided abdominal pain.  Patient is [redacted] weeks pregnant.  Signed out pending MRI abdomen pelvis for possible appendicitis.  Patient receiving Rocephin for UTI.   6:08 PM MRI showed no appendicitis, just R ovarian cyst. OB US unremarkable. Given rocephin, patient comfortable in the ED. Will dc home with keflex. Patient states that she has appointment tomorrow with an OB doctor but can't tell me who. Will refer to [facility] in case she can't follow up with her OB.       Charlynne Pander, MD 08/02/18 Mallie Snooks    Charlynne Pander, MD 08/02/18 [phone]

## 2018-08-02 NOTE — ED Provider Notes (Signed)
[facility]-EMERGENCY DEPT Provider Note   CSN: [id number] Arrival date & time: 08/02/18  1036     History   Chief Complaint Chief Complaint  Patient presents with  . Abdominal Pain  . Flank Pain    HPI Jessica Riddle is a 23 y.o. female.  The history is provided by the patient and medical records. No language interpreter was used.  Abdominal Pain    Flank Pain  Associated symptoms include abdominal pain.   Jessica Riddle is a 23 y.o. female who presents to the Emergency Department complaining of abdominal pain.  She is a G2P1001 that presents to the ED for evaluation of RLQ pain that began this morning at 8am.  Pain is sharp, severe and constant in nature.  It radiates to her low back.  She denies fevers, nausea, vomiting, diarrhea, contractions, dysuria, hematuria, LOF, vaginal bleeding.  No prior similar sxs.  She has a hx/o cholecystectomy.  Due date May 10.  First day of LMP about July 20th.  She went to [facility] when she was about [redacted] weeks pregnant for confirmation of pregnancy.  She does not have an OBGYN. She is estimated to by [redacted] weeks pregnant.  No prior similar sxs.   Past Medical History:  Diagnosis Date  . Gall stones   . Medical history non-contributory     Patient Active Problem List   Diagnosis Date Noted  . SVD (spontaneous vaginal delivery) 03/19/2017  . Labor and delivery, indication for care 03/17/2017    Past Surgical History:  Procedure Laterality Date  . NO PAST SURGERIES       OB History    Gravida  3   Para  1   Term  1   Preterm  0   AB  0   Living  1     SAB  0   TAB  0   Ectopic  0   Multiple  0   Live Births  1            Home Medications    Prior to Admission medications   Not on File    Family History Family History  Problem Relation Age of Onset  . Diabetes Maternal Grandmother     Social History Social History   Tobacco Use  . Smoking status:  Never Smoker  . Smokeless tobacco: Never Used  Substance Use Topics  . Alcohol use: No  . Drug use: No     Allergies   Patient has no known allergies.   Review of Systems Review of Systems  Gastrointestinal: Positive for abdominal pain.  Genitourinary: Positive for flank pain.  All other systems reviewed and are negative.    Physical Exam Updated Vital Signs BP 119/77   Pulse (!) 102   Temp 98.1 F (36.7 C) (Oral)   Resp 16   Ht 5\' 3"  (1.6 m)   Wt 90.7 kg   SpO2 100%   BMI 35.43 kg/m   Physical Exam Vitals signs and nursing note reviewed.  Constitutional:      Appearance: She is well-developed.  HENT:     Head: Normocephalic and atraumatic.  Cardiovascular:     Rate and Rhythm: Normal rate and regular rhythm.     Heart sounds: No murmur.  Pulmonary:     Effort: Pulmonary effort is normal. No respiratory distress.     Breath sounds: Normal breath sounds.  Abdominal:     Tenderness: There is no  guarding or rebound.     Comments: Gravid uterus to above umbilicus.  Moderate RLQ tenderness, voluntary guarding, no rebound.   Musculoskeletal:        General: No tenderness.  Skin:    General: Skin is warm and dry.  Neurological:     Mental Status: She is alert and oriented to person, place, and time.  Psychiatric:        Mood and Affect: Mood normal.        Behavior: Behavior normal.      ED Treatments / Results  Labs (all labs ordered are listed, but only abnormal results are displayed) Labs Reviewed  COMPREHENSIVE METABOLIC PANEL - Abnormal; Notable for the following components:      Result Value   CO2 21 (*)    Glucose, Bld 107 (*)    Creatinine, Ser 0.36 (*)    Calcium 8.5 (*)    Albumin 3.0 (*)    AST 14 (*)    All other components within normal limits  CBC - Abnormal; Notable for the following components:   WBC 13.6 (*)    Hemoglobin 11.4 (*)    HCT 35.5 (*)    All other components within normal limits  URINALYSIS, ROUTINE W REFLEX  MICROSCOPIC - Abnormal; Notable for the following components:   APPearance HAZY (*)    Ketones, ur 20 (*)    Leukocytes, UA MODERATE (*)    Bacteria, UA MANY (*)    All other components within normal limits  I-STAT BETA HCG BLOOD, ED ([facility], [facility], [facility] ONLY) - Abnormal; Notable for the following components:   I-stat hCG, quantitative >2,000.0 (*)    All other components within normal limits  URINE CULTURE  LIPASE, BLOOD    EKG None  Radiology [place] Ob Limited > 14 Wks  Result Date: 08/02/2018 CLINICAL DATA:  Right lower quadrant pain. Patient known to be [redacted] weeks pregnant. EXAM: LIMITED OBSTETRIC ULTRASOUND AN DOPPLER FINDINGS: Number of Fetuses: 1 Heart Rate:  143 bpm Movement: Yes. Presentation: Transverse lie with head towards maternal left side. Placental Location: Anterior Previa: No. Amniotic Fluid (Subjective):  Within normal limits. BPD:  5.44cm 22w 4d MATERNAL FINDINGS: Cervix:  Appears closed. Uterus/Adnexae: Right ovary measures 6.5 x 8.9 x 9 cm and demonstrates a 7.4 cm simple cyst. Normal arterial and venous flow to the right ovary. Left ovary not visualized. Other findings No free fluid. IMPRESSION: Single live IUP with estimated gestational age 10822 weeks 4 days. 7.4 cm simple right ovarian cyst.  Left ovary not visualized. This exam is performed on an emergent basis and does not comprehensively evaluate fetal size, dating, or anatomy; follow-up complete OB US should be considered if further fetal assessment is warranted. Electronically Signed   By: Elberta Fortisaniel  Boyle M.D.   On: 08/02/2018 13:42   [place] Renal  Result Date: 08/02/2018 CLINICAL DATA:  Right lower quadrant pain. Patient [redacted] weeks pregnant. EXAM: RENAL / URINARY TRACT ULTRASOUND COMPLETE COMPARISON:  None. FINDINGS: Right Kidney: Renal measurements: 5.9 x 6.4 x 11.3 cm = volume: 220 mL . Echogenicity within normal limits. No mass or hydronephrosis visualized. Left Kidney: Renal measurements: 5.2 x 6.4 x 11.9 cm = volume: 208 mL.  Echogenicity within normal limits. No mass or hydronephrosis visualized. Bladder: Appears normal for degree of bladder distention. Simple cystic structure inferior to the right kidney measuring 8.7 cm and separate from the right kidney likely representing a right ovarian cyst. Patient states she has a known right ovarian cyst.  IMPRESSION: Normal kidneys without hydronephrosis. Incidental finding of 8.7 cm simple cystic structure inferior to the right kidney likely representing patient's known right ovarian cyst. Electronically Signed   By: Elberta Fortis M.D.   On: 08/02/2018 13:35   [place] Art/ven Flow Abd Pelv Doppler  Result Date: 08/02/2018 CLINICAL DATA:  Right lower quadrant pain. Patient known to be [redacted] weeks pregnant. EXAM: LIMITED OBSTETRIC ULTRASOUND AN DOPPLER FINDINGS: Number of Fetuses: 1 Heart Rate:  143 bpm Movement: Yes. Presentation: Transverse lie with head towards maternal left side. Placental Location: Anterior Previa: No. Amniotic Fluid (Subjective):  Within normal limits. BPD:  5.44cm 22w 4d MATERNAL FINDINGS: Cervix:  Appears closed. Uterus/Adnexae: Right ovary measures 6.5 x 8.9 x 9 cm and demonstrates a 7.4 cm simple cyst. Normal arterial and venous flow to the right ovary. Left ovary not visualized. Other findings No free fluid. IMPRESSION: Single live IUP with estimated gestational age 107 weeks 4 days. 7.4 cm simple right ovarian cyst.  Left ovary not visualized. This exam is performed on an emergent basis and does not comprehensively evaluate fetal size, dating, or anatomy; follow-up complete OB US should be considered if further fetal assessment is warranted. Electronically Signed   By: Elberta Fortis M.D.   On: 08/02/2018 13:42    Procedures Procedures (including critical care time)  Medications Ordered in ED Medications  sodium chloride 0.9 % bolus 1,000 mL (0 mLs Intravenous Stopped 08/02/18 1355)  morphine 4 MG/ML injection 4 mg (4 mg Intravenous Given 08/02/18 1254)  cefTRIAXone  (ROCEPHIN) 1 g in sodium chloride 0.9 % 100 mL IVPB (0 g Intravenous Paused 08/02/18 1545)     Initial Impression / Assessment and Plan / ED Course  I have reviewed the triage vital signs and the nursing notes.  Pertinent labs & imaging results that were available during my care of the patient were reviewed by me and considered in my medical decision making (see chart for details).     Patient is a G2 P1, [redacted] weeks pregnant here for evaluation of right lower quadrant pain. She had significant tenderness on initial evaluation. Following treatment with pain medications she states her pain is resolved but she does have persistent tenderness on exam. Ultrasound demonstrates a fetus that is 22 weeks and four days as well is a simple right ovarian cyst without evidence of torsion. Urinalysis is concerning for UTI but there is some contaminant with epithelial's. Given pregnancy will treat for UTI and send culture. Ultrasound is negative for hydronephrosis, likely no obstructing stone. Given her ongoing tenderness plan to obtain MRI abdomen to rule out appendicitis. Patient care transferred pending MRI. If MRI is negative plan to discharge home with treatment for urinary tract infection as well as OB/GYN follow-up.  Final Clinical Impressions(s) / ED Diagnoses   Final diagnoses:  Acute UTI  Cyst of right ovary    ED Discharge Orders    None       Tilden Fossa, MD 08/02/18 [phone]

## 2018-08-02 NOTE — ED Notes (Signed)
Patient transported to MRI 

## 2018-08-02 NOTE — ED Triage Notes (Addendum)
Pt presents with c/o right lower abdominal pain radiating to her back. Pt reports she woke up this morning with this pain. Pt denies any N/V/D. Pt denies any hx of kidney stones. Pt is also [redacted] weeks pregnant, positive fetal movement today.

## 2018-08-02 NOTE — Discharge Instructions (Addendum)
Take keflex four times daily for a week.   Follow up with OB as scheduled tomorrow. You can also follow up at [facility] center   You have an ovarian cyst on the right side that is likely causing your pain. You may take tylenol for pain   Return to ER if you have fever, vomiting, severe abdominal pain

## 2018-08-02 NOTE — ED Notes (Signed)
Bed: WA07 Expected date:  Expected time:  Means of arrival:  Comments: Hold for triage 

## 2018-08-02 NOTE — ED Notes (Signed)
Ultrasound at bedside

## 2018-08-04 LAB — URINE CULTURE: Culture: NO GROWTH

## 2018-08-20 ENCOUNTER — Encounter (HOSPITAL_COMMUNITY): Payer: Self-pay | Admitting: *Deleted

## 2018-08-21 ENCOUNTER — Ambulatory Visit (HOSPITAL_COMMUNITY): Payer: Self-pay | Admitting: Nurse Practitioner

## 2018-08-21 ENCOUNTER — Ambulatory Visit (HOSPITAL_COMMUNITY)
Admission: RE | Admit: 2018-08-21 | Discharge: 2018-08-21 | Disposition: A | Discharge status: Home / Self Care | Payer: Self-pay | Source: Ambulatory Visit | Attending: Nurse Practitioner | Admitting: Nurse Practitioner

## 2018-08-21 HISTORY — DX: Unspecified ovarian cyst, unspecified side: N83.209

## 2018-12-01 ENCOUNTER — Other Ambulatory Visit: Payer: Self-pay

## 2018-12-01 ENCOUNTER — Encounter (HOSPITAL_COMMUNITY): Payer: Self-pay

## 2018-12-01 ENCOUNTER — Inpatient Hospital Stay (HOSPITAL_COMMUNITY): Payer: Medicaid Other | Admitting: Anesthesiology

## 2018-12-01 ENCOUNTER — Inpatient Hospital Stay (HOSPITAL_COMMUNITY)
Admission: AD | Admit: 2018-12-01 | Discharge: 2018-12-03 | DRG: 807 | Disposition: A | Discharge status: Home / Self Care | Payer: Medicaid Other | Attending: Family Medicine | Admitting: Family Medicine

## 2018-12-01 ENCOUNTER — Inpatient Hospital Stay (HOSPITAL_COMMUNITY)
Admission: AD | Admit: 2018-12-01 | Discharge: 2018-12-01 | Disposition: A | Discharge status: Home / Self Care | Payer: Medicaid Other | Attending: Obstetrics & Gynecology | Admitting: Obstetrics & Gynecology

## 2018-12-01 DIAGNOSIS — Z3A00 Weeks of gestation of pregnancy not specified: Secondary | ICD-10-CM | POA: Insufficient documentation

## 2018-12-01 DIAGNOSIS — O471 False labor at or after 37 completed weeks of gestation: Secondary | ICD-10-CM | POA: Diagnosis present

## 2018-12-01 DIAGNOSIS — O26893 Other specified pregnancy related conditions, third trimester: Secondary | ICD-10-CM | POA: Diagnosis present

## 2018-12-01 DIAGNOSIS — Z3A39 39 weeks gestation of pregnancy: Secondary | ICD-10-CM

## 2018-12-01 LAB — URINALYSIS, ROUTINE W REFLEX MICROSCOPIC
Bilirubin Urine: NEGATIVE
Glucose, UA: NEGATIVE mg/dL
Ketones, ur: NEGATIVE mg/dL
Nitrite: NEGATIVE
Protein, ur: NEGATIVE mg/dL
Specific Gravity, Urine: 1.021 (ref 1.005–1.030)
pH: 6 (ref 5.0–8.0)

## 2018-12-01 LAB — ABO/RH: ABO/RH(D): O POS

## 2018-12-01 LAB — CBC
HCT: 32.4 % — ABNORMAL LOW (ref 36.0–46.0)
Hemoglobin: 10.3 g/dL — ABNORMAL LOW (ref 12.0–15.0)
MCH: 25.6 pg — ABNORMAL LOW (ref 26.0–34.0)
MCHC: 31.8 g/dL (ref 30.0–36.0)
MCV: 80.4 fL (ref 80.0–100.0)
Platelets: 248 10*3/uL (ref 150–400)
RBC: 4.03 MIL/uL (ref 3.87–5.11)
RDW: 15.7 % — ABNORMAL HIGH (ref 11.5–15.5)
WBC: 15.7 10*3/uL — ABNORMAL HIGH (ref 4.0–10.5)
nRBC: 0 % (ref 0.0–0.2)

## 2018-12-01 LAB — TYPE AND SCREEN
ABO/RH(D): O POS
Antibody Screen: NEGATIVE

## 2018-12-01 MED ORDER — DIPHENHYDRAMINE HCL 50 MG/ML IJ SOLN: 12.5000 mg | INTRAMUSCULAR | Status: DC | PRN

## 2018-12-01 MED ORDER — LIDOCAINE HCL (PF) 1 % IJ SOLN
INTRAMUSCULAR | Status: DC | PRN
  Administered 2018-12-01: 11 mL via EPIDURAL

## 2018-12-01 MED ORDER — EPHEDRINE 5 MG/ML INJ: 10.0000 mg | INTRAVENOUS | Status: DC | PRN

## 2018-12-01 MED ORDER — LACTATED RINGERS IV SOLN: 500.0000 mL | Freq: Once | INTRAVENOUS | Status: DC

## 2018-12-01 MED ORDER — LIDOCAINE HCL (PF) 1 % IJ SOLN: 30.0000 mL | INTRAMUSCULAR | Status: DC | PRN

## 2018-12-01 MED ORDER — SOD CITRATE-CITRIC ACID 500-334 MG/5ML PO SOLN: 30.0000 mL | ORAL | Status: DC | PRN

## 2018-12-01 MED ORDER — LACTATED RINGERS IV SOLN: 500.0000 mL | INTRAVENOUS | Status: DC | PRN

## 2018-12-01 MED ORDER — OXYTOCIN 40 UNITS IN NORMAL SALINE INFUSION - SIMPLE MED
2.5000 [IU]/h | INTRAVENOUS | Status: DC
  Filled 2018-12-01: qty 1000

## 2018-12-01 MED ORDER — OXYTOCIN BOLUS FROM INFUSION
500.0000 mL | Freq: Once | INTRAVENOUS | Status: AC
  Administered 2018-12-02: 500 mL via INTRAVENOUS

## 2018-12-01 MED ORDER — ONDANSETRON HCL 4 MG/2ML IJ SOLN: 4.0000 mg | Freq: Four times a day (QID) | INTRAMUSCULAR | Status: DC | PRN

## 2018-12-01 MED ORDER — SODIUM CHLORIDE (PF) 0.9 % IJ SOLN
INTRAMUSCULAR | Status: DC | PRN
  Administered 2018-12-01: 12 mL/h via EPIDURAL

## 2018-12-01 MED ORDER — FENTANYL-BUPIVACAINE-NACL 0.5-0.125-0.9 MG/250ML-% EP SOLN
EPIDURAL | Status: AC
  Filled 2018-12-01: qty 250

## 2018-12-01 MED ORDER — FENTANYL CITRATE (PF) 100 MCG/2ML IJ SOLN: 100.0000 ug | INTRAMUSCULAR | Status: DC | PRN

## 2018-12-01 MED ORDER — PHENYLEPHRINE 40 MCG/ML (10ML) SYRINGE FOR IV PUSH (FOR BLOOD PRESSURE SUPPORT): 80.0000 ug | PREFILLED_SYRINGE | INTRAVENOUS | Status: DC | PRN

## 2018-12-01 MED ORDER — FENTANYL-BUPIVACAINE-NACL 0.5-0.125-0.9 MG/250ML-% EP SOLN: 12.0000 mL/h | EPIDURAL | Status: DC | PRN

## 2018-12-01 MED ORDER — LACTATED RINGERS IV SOLN
INTRAVENOUS | Status: DC
  Administered 2018-12-01: 21:00:00 via INTRAVENOUS

## 2018-12-01 MED ORDER — FLEET ENEMA 7-19 GM/118ML RE ENEM: 1.0000 | ENEMA | RECTAL | Status: DC | PRN

## 2018-12-01 NOTE — H&P (Signed)
OBSTETRIC ADMISSION HISTORY AND PHYSICAL  Jessica Riddle is a 23 y.o. female [phone] with IUP at [redacted]w[redacted]d by L/22 presenting for spontaneous onset of labor.   Reports fetal movement. Denies vaginal bleeding, leakage of fluids.   She received her prenatal care at [facility].  Support person in labor: Husband  Ultrasounds . 22w4: viability, dating U/S, also shows simple right ovarian cyst  . 24w4: reported as normal but U/S report not available  Prenatal History/Complications: . No complications   Past Medical History: Past Medical History:  Diagnosis Date  . Gall stones   . Medical history non-contributory   . Ovarian cyst 08/02/2018    Past Surgical History: Past Surgical History:  Procedure Laterality Date  . CHOLECYSTECTOMY  2018    Obstetrical History: OB History    Gravida  3   Para  2   Term  2   Preterm  0   AB  0   Living  2     SAB  0   TAB  0   Ectopic  0   Multiple  0   Live Births  2           Social History: Social History   Socioeconomic History  . Marital status: Single    Spouse name: Not on file  . Number of children: Not on file  . Years of education: Not on file  . Highest education level: Not on file  Occupational History  . Not on file  Social Needs  . Financial resource strain: Not on file  . Food insecurity:    Worry: Not on file    Inability: Not on file  . Transportation needs:    Medical: Not on file    Non-medical: Not on file  Tobacco Use  . Smoking status: Never Smoker  . Smokeless tobacco: Never Used  Substance and Sexual Activity  . Alcohol use: No  . Drug use: No  . Sexual activity: Yes    Birth control/protection: None  Lifestyle  . Physical activity:    Days per week: Not on file    Minutes per session: Not on file  . Stress: Not on file  Relationships  . Social connections:    Talks on phone: Not on file    Gets together: Not on file    Attends religious service: Not on file    Active  member of club or organization: Not on file    Attends meetings of clubs or organizations: Not on file    Relationship status: Not on file  Other Topics Concern  . Not on file  Social History Narrative  . Not on file    Family History: Family History  Problem Relation Age of Onset  . Diabetes Maternal Grandmother     Allergies: No Known Allergies  Medications Prior to Admission  Medication Sig Dispense Refill Last Dose  . Prenatal Vit-Fe Fumarate-FA (PRENATAL MULTIVITAMIN) TABS tablet Take 1 tablet by mouth daily at 12 noon.   12/01/2018 at Unknown time     Review of Systems  All systems reviewed and negative except as stated in HPI  Blood pressure 111/74, pulse (!) 105, temperature 98.2 F (36.8 C), temperature source Oral, resp. rate 18, height  (1.6 m), weight 94.3 kg, SpO2 99 %, unknown if currently breastfeeding. General appearance: uncomfortable appearing, breathing through contractions Lungs: no respiratory distress Heart: regular rate  Abdomen: soft, non-tender; gravid Pelvic: deferred Extremities: no significant LE edema Presentation: cephalic by  sutures on RN check  Fetal monitoring: 150s/mod/+a/-d Uterine activity: every 2-4 minutes Dilation: 6 Effacement (%): 100 Station: -1 Exam by:: Irving Burton Rothermel RN   Prenatal labs: ABO, Rh: --/--/O POS, O POS Performed at [facility], [address] [place], [place] [address]  [phone] 2110) Antibody: NEG (05/05 2110) Rubella:  immune RPR:   non-reactive HBsAg:   non-reactive HIV:   non-reactive GBS:   negative Glucola: normal  Genetic screening:  Not done  Prenatal Transfer Tool  Maternal Diabetes: No Genetic Screening: not done Maternal Ultrasounds/Referrals: Normal - though not all reports available for review Fetal Ultrasounds or other Referrals:  None Maternal Substance Abuse:  No Significant Maternal Medications:  None Significant Maternal Lab Results: None  Results for orders placed or  performed during the hospital encounter of 12/01/18 (from the past 24 hour(s))  Type and screen [facility]   Collection Time: 12/01/18  9:10 PM  Result Value Ref Range   ABO/RH(D) O POS    Antibody Screen NEG    Sample Expiration      12/04/2018,2359 Performed at [facility], [address] [place], [place] [address]   ABO/Rh   Collection Time: 12/01/18  9:10 PM  Result Value Ref Range   ABO/RH(D)      O POS Performed at [facility], [address] [place], [place] [address]   CBC   Collection Time: 12/01/18  9:18 PM  Result Value Ref Range   WBC 15.7 (H) 4.0 - 10.5 K/uL   RBC 4.03 3.87 - 5.11 MIL/uL   Hemoglobin 10.3 (L) 12.0 - 15.0 g/dL   HCT 43.8 (L) 38.1 - 84.0 %   MCV 80.4 80.0 - 100.0 fL   MCH 25.6 (L) 26.0 - 34.0 pg   MCHC 31.8 30.0 - 36.0 g/dL   RDW 37.5 (H) 43.6 - 06.7 %   Platelets 248 150 - 400 K/uL   nRBC 0.0 0.0 - 0.2 %  Results for orders placed or performed during the hospital encounter of 12/01/18 (from the past 24 hour(s))  Urinalysis, Routine w reflex microscopic   Collection Time: 12/01/18  9:44 AM  Result Value Ref Range   Color, Urine YELLOW YELLOW   APPearance CLEAR CLEAR   Specific Gravity, Urine 1.021 1.005 - 1.030   pH 6.0 5.0 - 8.0   Glucose, UA NEGATIVE NEGATIVE mg/dL   Hgb urine dipstick SMALL (A) NEGATIVE   Bilirubin Urine NEGATIVE NEGATIVE   Ketones, ur NEGATIVE NEGATIVE mg/dL   Protein, ur NEGATIVE NEGATIVE mg/dL   Nitrite NEGATIVE NEGATIVE   Leukocytes,Ua TRACE (A) NEGATIVE   RBC / HPF 0-5 0 - 5 RBC/hpf   WBC, UA 0-5 0 - 5 WBC/hpf   Bacteria, UA RARE (A) NONE SEEN   Squamous Epithelial / LPF 0-5 0 - 5   Mucus PRESENT     Patient Active Problem List   Diagnosis Date Noted  . Labor and delivery, indication for care 03/17/2017    Assessment/Plan:  Tasma Raybon is a 23 y.o. G3P2002 at [redacted]w[redacted]d here for spontaneous onset of labor.   Labor: SOL, expectant management.  -- pain control:  epidural - may need to be replaced because poor pain control at this time   Fetal Wellbeing: EFW 7-8lbs by Leopold's. Cephalic by sutures on RN check.  -- GBS (negative) -- continuous fetal monitoring - category I    Postpartum Planning -- breast/undecided -- RI/[x] Tdap   Keaundre Thelin S. Earlene Plater,  DO OB/GYN Fellow

## 2018-12-01 NOTE — Discharge Instructions (Signed)
Fetal Movement Counts  Patient Name: ________________________________________________ Patient Due Date: ____________________  What is a fetal movement count?    A fetal movement count is the number of times that you feel your baby move during a certain amount of time. This may also be called a fetal kick count. A fetal movement count is recommended for every pregnant woman. You may be asked to start counting fetal movements as early as week 28 of your pregnancy.  Pay attention to when your baby is most active. You may notice your baby's sleep and wake cycles. You may also notice things that make your baby move more. You should do a fetal movement count:   When your baby is normally most active.   At the same time each day.  A good time to count movements is while you are resting, after having something to eat and drink.  How do I count fetal movements?  1. Find a quiet, comfortable area. Sit, or lie down on your side.  2. Write down the date, the start time and stop time, and the number of movements that you felt between those two times. Take this information with you to your health care visits.  3. For 2 hours, count kicks, flutters, swishes, rolls, and jabs. You should feel at least 10 movements during 2 hours.  4. You may stop counting after you have felt 10 movements.  5. If you do not feel 10 movements in 2 hours, have something to eat and drink. Then, keep resting and counting for 1 hour. If you feel at least 4 movements during that hour, you may stop counting.  Contact a health care provider if:   You feel fewer than 4 movements in 2 hours.   Your baby is not moving like he or she usually does.  Date: ____________ Start time: ____________ Stop time: ____________ Movements: ____________  Date: ____________ Start time: ____________ Stop time: ____________ Movements: ____________  Date: ____________ Start time: ____________ Stop time: ____________ Movements: ____________  Date: ____________ Start time:  ____________ Stop time: ____________ Movements: ____________  Date: ____________ Start time: ____________ Stop time: ____________ Movements: ____________  Date: ____________ Start time: ____________ Stop time: ____________ Movements: ____________  Date: ____________ Start time: ____________ Stop time: ____________ Movements: ____________  Date: ____________ Start time: ____________ Stop time: ____________ Movements: ____________  Date: ____________ Start time: ____________ Stop time: ____________ Movements: ____________  This information is not intended to replace advice given to you by your health care provider. Make sure you discuss any questions you have with your health care provider.  Document Released: 08/14/2006 Document Revised: 03/13/2016 Document Reviewed: 08/24/2015  Elsevier Interactive Patient Education  2019 Elsevier Inc[employer]  Vaginal Delivery, Care After  Refer to this sheet in the next few weeks. These instructions provide you with information about caring for yourself after vaginal delivery. Your health care provider may also give you more specific instructions. Your treatment has been planned according to current medical practices, but problems sometimes occur. Call your health care provider if you have any problems or questions.  What can I expect after the procedure?  After vaginal delivery, it is common to have:   Some bleeding from your vagina.   Soreness in your abdomen, your vagina, and the area of skin between your vaginal opening and your anus (perineum).   Pelvic cramps.   Fatigue.  Follow these instructions at home:  Medicines   Take over-the-counter and prescription medicines only as told by your health care provider.     If you were prescribed an antibiotic medicine, take it as told by your health care provider. Do not stop taking the antibiotic until it is finished.  Driving     Do not drive or operate heavy machinery while taking prescription pain medicine.   Do not drive for 24  hours if you received a sedative.  Lifestyle   Do not drink alcohol. This is especially important if you are breastfeeding or taking medicine to relieve pain.   Do not use tobacco products, including cigarettes, chewing tobacco, or e-cigarettes. If you need help quitting, ask your health care provider.  Eating and drinking   Drink at least 8 eight-ounce glasses of water every day unless you are told not to by your health care provider. If you choose to breastfeed your baby, you may need to drink more water than this.   Eat high-fiber foods every day. These foods may help prevent or relieve constipation. High-fiber foods include:  ? Whole grain cereals and breads.  ? Brown rice.  ? Beans.  ? Fresh fruits and vegetables.  Activity   Return to your normal activities as told by your health care provider. Ask your health care provider what activities are safe for you.   Rest as much as possible. Try to rest or take a nap when your baby is sleeping.   Do not lift anything that is heavier than your baby or 10 lb (4.5 kg) until your health care provider says that it is safe.   Talk with your health care provider about when you can engage in sexual activity. This may depend on your:  ? Risk of infection.  ? Rate of healing.  ? Comfort and desire to engage in sexual activity.  Vaginal Care   If you have an episiotomy or a vaginal tear, check the area every day for signs of infection. Check for:  ? More redness, swelling, or pain.  ? More fluid or blood.  ? Warmth.  ? Pus or a bad smell.   Do not use tampons or douches until your health care provider says this is safe.   Watch for any blood clots that may pass from your vagina. These may look like clumps of dark red, brown, or black discharge.  General instructions   Keep your perineum clean and dry as told by your health care provider.   Wear loose, comfortable clothing.   Wipe from front to back when you use the toilet.   Ask your health care provider if you  can shower or take a bath. If you had an episiotomy or a perineal tear during labor and delivery, your health care provider may tell you not to take baths for a certain length of time.   Wear a bra that supports your breasts and fits you well.   If possible, have someone help you with household activities and help care for your baby for at least a few days after you leave the hospital.   Keep all follow-up visits for you and your baby as told by your health care provider. This is important.  Contact a health care provider if:   You have:  ? Vaginal discharge that has a bad smell.  ? Difficulty urinating.  ? Pain when urinating.  ? A sudden increase or decrease in the frequency of your bowel movements.  ? More redness, swelling, or pain around your episiotomy or vaginal tear.  ? More fluid or blood coming from your episiotomy or vaginal   tear.  ? Pus or a bad smell coming from your episiotomy or vaginal tear.  ? A fever.  ? A rash.  ? Little or no interest in activities you used to enjoy.  ? Questions about caring for yourself or your baby.   Your episiotomy or vaginal tear feels warm to the touch.   Your episiotomy or vaginal tear is separating or does not appear to be healing.   Your breasts are painful, hard, or turn red.   You feel unusually sad or worried.   You feel nauseous or you vomit.   You pass large blood clots from your vagina. If you pass a blood clot from your vagina, save it to show to your health care provider. Do not flush blood clots down the toilet without having your health care provider look at them.   You urinate more than usual.   You are dizzy or light-headed.   You have not breastfed at all and you have not had a menstrual period for 12 weeks after delivery.   You have stopped breastfeeding and you have not had a menstrual period for 12 weeks after you stopped breastfeeding.  Get help right away if:   You have:  ? Pain that does not go away or does not get better with  medicine.  ? Chest pain.  ? Difficulty breathing.  ? Blurred vision or spots in your vision.  ? Thoughts about hurting yourself or your baby.   You develop pain in your abdomen or in one of your legs.   You develop a severe headache.   You faint.   You bleed from your vagina so much that you fill two sanitary pads in one hour.  This information is not intended to replace advice given to you by your health care provider. Make sure you discuss any questions you have with your health care provider.  Document Released: 07/12/2000 Document Revised: 12/27/2015 Document Reviewed: 07/30/2015  Elsevier Interactive Patient Education  2019 Elsevier Inc.

## 2018-12-01 NOTE — Anesthesia Preprocedure Evaluation (Signed)
Anesthesia Evaluation  Patient identified by MRN, date of birth, ID band Patient awake    Reviewed: Allergy & Precautions, H&P , NPO status , Patient's Chart, lab work & pertinent test results  Airway Mallampati: II  TM Distance: >3 FB Neck ROM: full    Dental no notable dental hx. (+) Teeth Intact   Pulmonary neg pulmonary ROS,    Pulmonary exam normal breath sounds clear to auscultation       Cardiovascular negative cardio ROS Normal cardiovascular exam Rhythm:regular Rate:Normal     Neuro/Psych negative neurological ROS  negative psych ROS   GI/Hepatic negative GI ROS, Neg liver ROS,   Endo/Other  negative endocrine ROS  Renal/GU negative Renal ROS  negative genitourinary   Musculoskeletal negative musculoskeletal ROS (+)   Abdominal (+) + obese,   Peds  Hematology negative hematology ROS (+)   Anesthesia Other Findings   Reproductive/Obstetrics (+) Pregnancy                             Anesthesia Physical Anesthesia Plan  ASA: II  Anesthesia Plan: Epidural   Post-op Pain Management:    Induction:   PONV Risk Score and Plan:   Airway Management Planned:   Additional Equipment:   Intra-op Plan:   Post-operative Plan:   Informed Consent: I have reviewed the patients History and Physical, chart, labs and discussed the procedure including the risks, benefits and alternatives for the proposed anesthesia with the patient or authorized representative who has indicated his/her understanding and acceptance.       Plan Discussed with:   Anesthesia Plan Comments:         Anesthesia Quick Evaluation  

## 2018-12-01 NOTE — MAU Note (Signed)
Pt states she was here early and was 3 cm. She was told to come back when the ctx's were more intense.   Pt states ctx's are now 6 minutes apart. Rating the pain 10/10 lower abdominal pain.   Reports +FM.   Denies vaginal bleeding or LOF

## 2018-12-01 NOTE — MAU Note (Signed)
Pt reports ctx q 6 min since 0300 this morning.  Denies LOF, reports some bloody show.  Reports good fetal movement.

## 2018-12-01 NOTE — Anesthesia Procedure Notes (Signed)
Epidural Patient location during procedure: OB Start time: 12/01/2018 10:18 PM End time: 12/01/2018 10:28 PM  Staffing Anesthesiologist: Lowella Curb, MD Performed: anesthesiologist   Preanesthetic Checklist Completed: patient identified, site marked, surgical consent, pre-op evaluation, timeout performed, IV checked, risks and benefits discussed and monitors and equipment checked  Epidural Patient position: sitting Prep: ChloraPrep Patient monitoring: heart rate, cardiac monitor, continuous pulse ox and blood pressure Approach: midline Location: L2-L3 Injection technique: LOR saline  Needle:  Needle type: Tuohy  Needle gauge: 17 G Needle length: 9 cm Needle insertion depth: 5 cm Catheter type: closed end flexible Catheter size: 20 Guage Catheter at skin depth: 9 cm Test dose: negative  Assessment Events: blood not aspirated, injection not painful, no injection resistance, negative IV test and no paresthesia  Additional Notes Reason for block:procedure for pain

## 2018-12-01 NOTE — MAU Note (Signed)
I have communicated with Philipp Deputy, CNM2 and reviewed vital signs:  Vitals:   12/01/18 0926 12/01/18 1107  BP: 126/70 131/75  Pulse: (!) 112   Resp: 20   Temp: 98.9 F (37.2 C)   SpO2: 100%     Vaginal exam:  Dilation: 3 Effacement (%): 70 Cervical Position: Posterior Station: -2 Presentation: Vertex Exam by:: B. Salik Grewell, RN ,   Also reviewed contraction pattern and that non-stress test is reactive.  It has been documented that patient is contracting every 3-6 minutes with no cervical change over 1.5 hours not indicating active labor.  Patient denies any other complaints.  Based on this report provider has given order for discharge.  A discharge order and diagnosis entered by a provider.   Labor discharge instructions reviewed with patient.

## 2018-12-02 ENCOUNTER — Encounter (HOSPITAL_COMMUNITY): Payer: Self-pay | Admitting: *Deleted

## 2018-12-02 DIAGNOSIS — Z3A39 39 weeks gestation of pregnancy: Secondary | ICD-10-CM

## 2018-12-02 LAB — RPR: RPR Ser Ql: NONREACTIVE

## 2018-12-02 MED ORDER — ZOLPIDEM TARTRATE 5 MG PO TABS: 5.0000 mg | ORAL_TABLET | Freq: Every evening | ORAL | Status: DC | PRN

## 2018-12-02 MED ORDER — TETANUS-DIPHTH-ACELL PERTUSSIS 5-2.5-18.5 LF-MCG/0.5 IM SUSP: 0.5000 mL | Freq: Once | INTRAMUSCULAR | Status: DC

## 2018-12-02 MED ORDER — DIPHENHYDRAMINE HCL 25 MG PO CAPS: 25.0000 mg | ORAL_CAPSULE | Freq: Four times a day (QID) | ORAL | Status: DC | PRN

## 2018-12-02 MED ORDER — PRENATAL MULTIVITAMIN CH
1.0000 | ORAL_TABLET | Freq: Every day | ORAL | Status: DC
  Administered 2018-12-02 – 2018-12-03 (×2): 1 via ORAL
  Filled 2018-12-02 (×2): qty 1

## 2018-12-02 MED ORDER — ACETAMINOPHEN 325 MG PO TABS
650.0000 mg | ORAL_TABLET | ORAL | Status: DC | PRN
  Administered 2018-12-02: 650 mg via ORAL
  Filled 2018-12-02: qty 2

## 2018-12-02 MED ORDER — MEASLES, MUMPS & RUBELLA VAC IJ SOLR: 0.5000 mL | Freq: Once | INTRAMUSCULAR | Status: DC

## 2018-12-02 MED ORDER — ONDANSETRON HCL 4 MG PO TABS: 4.0000 mg | ORAL_TABLET | ORAL | Status: DC | PRN

## 2018-12-02 MED ORDER — ONDANSETRON HCL 4 MG/2ML IJ SOLN: 4.0000 mg | INTRAMUSCULAR | Status: DC | PRN

## 2018-12-02 MED ORDER — SIMETHICONE 80 MG PO CHEW: 80.0000 mg | CHEWABLE_TABLET | ORAL | Status: DC | PRN

## 2018-12-02 MED ORDER — WITCH HAZEL-GLYCERIN EX PADS
1.0000 "application " | MEDICATED_PAD | CUTANEOUS | Status: DC | PRN
  Administered 2018-12-02: 1 via TOPICAL

## 2018-12-02 MED ORDER — COCONUT OIL OIL
1.0000 "application " | TOPICAL_OIL | Status: DC | PRN
  Administered 2018-12-02: 1 via TOPICAL

## 2018-12-02 MED ORDER — DIBUCAINE (PERIANAL) 1 % EX OINT
1.0000 "application " | TOPICAL_OINTMENT | CUTANEOUS | Status: DC | PRN
  Administered 2018-12-02: 1 via RECTAL
  Filled 2018-12-02: qty 28

## 2018-12-02 MED ORDER — IBUPROFEN 600 MG PO TABS
600.0000 mg | ORAL_TABLET | Freq: Four times a day (QID) | ORAL | Status: DC
  Administered 2018-12-02 – 2018-12-03 (×6): 600 mg via ORAL
  Filled 2018-12-02 (×6): qty 1

## 2018-12-02 MED ORDER — SENNOSIDES-DOCUSATE SODIUM 8.6-50 MG PO TABS
2.0000 | ORAL_TABLET | ORAL | Status: DC
  Administered 2018-12-03: 2 via ORAL
  Filled 2018-12-02: qty 2

## 2018-12-02 MED ORDER — BENZOCAINE-MENTHOL 20-0.5 % EX AERO
1.0000 "application " | INHALATION_SPRAY | CUTANEOUS | Status: DC | PRN
  Administered 2018-12-02: 1 via TOPICAL
  Filled 2018-12-02: qty 56

## 2018-12-02 NOTE — Anesthesia Postprocedure Evaluation (Signed)
Anesthesia Post Note  Patient: Jessica Riddle  Procedure(s) Performed: AN AD HOC LABOR EPIDURAL     Patient location during evaluation: Mother Baby Anesthesia Type: Epidural Level of consciousness: awake Pain management: satisfactory to patient Vital Signs Assessment: post-procedure vital signs reviewed and stable Respiratory status: spontaneous breathing Cardiovascular status: stable Anesthetic complications: no    Last Vitals:  Vitals:   12/02/18 0230 12/02/18 0400  BP: 120/62 (!) 110/57  Pulse: 81 89  Resp: 18 16  Temp: 36.9 C 36.8 C  SpO2:  98%    Last Pain:  Vitals:   12/02/18 0400  TempSrc: Oral  PainSc: 4    Pain Goal:                   [employer]

## 2018-12-02 NOTE — Lactation Note (Signed)
This note was copied from a baby's chart. Lactation Consultation Note  Patient Name: Boy Mama Podolak TRRNH'A Date: 12/02/2018 Reason for consult: Initial assessment;Term P3 Baby is 31 hours old.  Mom breastfed her last baby for one year.  She states newborn is latching well.  He is currently sleeping in crib.  Instructed to feed with cues and call for assist prn.  Mom denies questions or concerns.  Breastfeeding consultation services and support information given and reviewed.  Maternal Data Does the patient have breastfeeding experience prior to this delivery?: Yes  Feeding    LATCH Score                   Interventions    Lactation Tools Discussed/Used     Consult Status Consult Status: Follow-up Date: 12/03/18 Follow-up type: In-patient    Huston Foley 12/02/2018, 12:23 PM

## 2018-12-02 NOTE — Discharge Summary (Signed)
Obstetrics Discharge Summary OB/GYN Faculty [facility]   Patient Name: Jessica Riddle DOB: 08/08/1995 MRN: [id number]  Date of admission: 12/01/2018 Delivering MD: Tamera StandsWALLACE, LAUREL S   Date of discharge: 12/03/2018  Admitting diagnosis: CTX 6MINS  Intrauterine pregnancy: 7552w3d     Secondary diagnosis:   Active Problems:   Labor and delivery, indication for care    Discharge diagnosis: Term Pregnancy Delivered                                        Postpartum procedures: None    Outpatient Follow-Up: [ ]  undecided on birth control, continue to counsel  Hospital course: Jessica Riddle is a 23 y.o. 7652w3d who was admitted for spontaneous onset of labor. Her pregnancy was uncomplicated. Her labor course was notable for admission in active labor, epidural placement, AROM just prior to delivery. Delivery was uncomplicated. Please see delivery/op note for additional details. Her postpartum course was uncomplicated. She was breastfeeding without difficulty.  By day of discharge, she was passing flatus, urinating, eating and drinking without difficulty. Her pain was well-controlled, and she was discharged home with Tylenol and IBU. She will follow-up in clinic in 4 weeks.   Physical exam  Vitals:   12/02/18 1100 12/02/18 1641 12/02/18 2150 12/03/18 0532  BP:  114/71 (!) 112/57 120/66  Pulse: 74 89 79 69  Resp: 16 18 16 18   Temp: 98.1 F (36.7 C) 97.6 F (36.4 C) 98.4 F (36.9 C) 98.1 F (36.7 C)  TempSrc: Oral Oral Oral Oral  SpO2: 99% 100%  99%  Weight:      Height:       General: alert, breastfeeding Lochia: appropriate Uterine Fundus: firm Incision: N/A DVT Evaluation: No evidence of DVT seen on physical exam. Labs: Lab Results  Component Value Date   WBC 15.7 (H) 12/01/2018   HGB 10.3 (L) 12/01/2018   HCT 32.4 (L) 12/01/2018   MCV 80.4 12/01/2018   PLT 248 12/01/2018   CMP Latest Ref Rng & Units 08/02/2018  Glucose 70 - 99 mg/dL 829(F107(H)  BUN 6 - 20 mg/dL 7   Creatinine 6.210.44 - 3.081.00 mg/dL 6.57(Q0.36(L)  Sodium 469135 - 629145 mmol/L 137  Potassium 3.5 - 5.1 mmol/L 3.9  Chloride 98 - 111 mmol/L 106  CO2 22 - 32 mmol/L 21(L)  Calcium 8.9 - 10.3 mg/dL 5.2(W8.5(L)  Total Protein 6.5 - 8.1 g/dL 6.8  Total Bilirubin 0.3 - 1.2 mg/dL 0.5  Alkaline Phos 38 - 126 U/L 79  AST 15 - 41 U/L 14(L)  ALT 0 - 44 U/L 11    Discharge instructions: Per After Visit Summary and "Baby and Me Booklet"  After visit meds:  Allergies as of 12/03/2018   No Known Allergies     Medication List    TAKE these medications   acetaminophen 325 MG tablet Commonly known as:  Tylenol Take 2 tablets (650 mg total) by mouth every 4 (four) hours as needed (for pain scale < 4).   ibuprofen 600 MG tablet Commonly known as:  ADVIL Take 1 tablet (600 mg total) by mouth every 6 (six) hours.   prenatal multivitamin Tabs tablet Take 1 tablet by mouth daily at 12 noon.       Postpartum contraception: IUD Diet: Routine Diet Activity: Advance as tolerated. Pelvic rest for 6 weeks.   Follow-up Appt: Encouraged patient to call [facility] to schedule postpartum visit  Newborn Data: Live born female  Birth Weight: 7-2 APGAR: 9, 9  Newborn Delivery   Birth date/time:  12/02/2018 00:32:00 Delivery type:  Vaginal, Spontaneous     Baby Feeding: Breast Disposition:home with mother

## 2018-12-03 DIAGNOSIS — Z3A39 39 weeks gestation of pregnancy: Secondary | ICD-10-CM

## 2018-12-03 MED ORDER — IBUPROFEN 600 MG PO TABS: 600.0000 mg | ORAL_TABLET | Freq: Four times a day (QID) | ORAL | 0 refills | Status: AC

## 2018-12-03 MED ORDER — ACETAMINOPHEN 325 MG PO TABS: 650.0000 mg | ORAL_TABLET | ORAL | 1 refills | Status: AC | PRN

## 2018-12-03 NOTE — Lactation Note (Signed)
This note was copied from a baby's chart. Lactation Consultation Note  Patient Name: Jessica Riddle Date: 12/03/2018 Reason for consult: Follow-up assessment;Term  105 - 29 - I visited [name] to conduct discharge education. Mom and dad were preparing to leave, and we had a brief conversation. She states that baby "Jessica Riddle" is latching well every 2-3 hours for at least 10 minutes per feeding. She reports that her breasts are tender from frequent feeding but WNL with no nipple trauma.  Mom had no questions or concerns about breast feeding. She is following up with her pediatrician tomorrow upon discharge. I provided her with our contact information and shared community resources.  Maternal Data Formula Feeding for Exclusion: No  Feeding Feeding Type: Breast Fed   Consult Status Consult Status: Complete Date: 12/03/18 Follow-up type: Call as needed    Walker Shadow 12/03/2018, 2:14 PM

## 2018-12-03 NOTE — Discharge Instructions (Signed)
Vaginal Delivery, Care After °Refer to this sheet in the next few weeks. These instructions provide you with information about caring for yourself after vaginal delivery. Your health care provider may also give you more specific instructions. Your treatment has been planned according to current medical practices, but problems sometimes occur. Call your health care provider if you have any problems or questions. °What can I expect after the procedure? °After vaginal delivery, it is common to have: °· Some bleeding from your vagina. °· Soreness in your abdomen, your vagina, and the area of skin between your vaginal opening and your anus (perineum). °· Pelvic cramps. °· Fatigue. °Follow these instructions at home: °Medicines °· Take over-the-counter and prescription medicines only as told by your health care provider. °· If you were prescribed an antibiotic medicine, take it as told by your health care provider. Do not stop taking the antibiotic until it is finished. °Driving ° °· Do not drive or operate heavy machinery while taking prescription pain medicine. °· Do not drive for 24 hours if you received a sedative. °Lifestyle °· Do not drink alcohol. This is especially important if you are breastfeeding or taking medicine to relieve pain. °· Do not use tobacco products, including cigarettes, chewing tobacco, or e-cigarettes. If you need help quitting, ask your health care provider. °Eating and drinking °· Drink at least 8 eight-ounce glasses of water every day unless you are told not to by your health care provider. If you choose to breastfeed your baby, you may need to drink more water than this. °· Eat high-fiber foods every day. These foods may help prevent or relieve constipation. High-fiber foods include: °? Whole grain cereals and breads. °? Brown rice. °? Beans. °? Fresh fruits and vegetables. °Activity °· Return to your normal activities as told by your health care provider. Ask your health care provider what  activities are safe for you. °· Rest as much as possible. Try to rest or take a nap when your baby is sleeping. °· Do not lift anything that is heavier than your baby or 10 lb (4.5 kg) until your health care provider says that it is safe. °· Talk with your health care provider about when you can engage in sexual activity. This may depend on your: °? Risk of infection. °? Rate of healing. °? Comfort and desire to engage in sexual activity. °Vaginal Care °· If you have an episiotomy or a vaginal tear, check the area every day for signs of infection. Check for: °? More redness, swelling, or pain. °? More fluid or blood. °? Warmth. °? Pus or a bad smell. °· Do not use tampons or douches until your health care provider says this is safe. °· Watch for any blood clots that may pass from your vagina. These may look like clumps of dark red, brown, or black discharge. °General instructions °· Keep your perineum clean and dry as told by your health care provider. °· Wear loose, comfortable clothing. °· Wipe from front to back when you use the toilet. °· Ask your health care provider if you can shower or take a bath. If you had an episiotomy or a perineal tear during labor and delivery, your health care provider may tell you not to take baths for a certain length of time. °· Wear a bra that supports your breasts and fits you well. °· If possible, have someone help you with household activities and help care for your baby for at least a few days after you   leave the hospital. °· Keep all follow-up visits for you and your baby as told by your health care provider. This is important. °Contact a health care provider if: °· You have: °? Vaginal discharge that has a bad smell. °? Difficulty urinating. °? Pain when urinating. °? A sudden increase or decrease in the frequency of your bowel movements. °? More redness, swelling, or pain around your episiotomy or vaginal tear. °? More fluid or blood coming from your episiotomy or vaginal  tear. °? Pus or a bad smell coming from your episiotomy or vaginal tear. °? A fever. °? A rash. °? Little or no interest in activities you used to enjoy. °? Questions about caring for yourself or your baby. °· Your episiotomy or vaginal tear feels warm to the touch. °· Your episiotomy or vaginal tear is separating or does not appear to be healing. °· Your breasts are painful, hard, or turn red. °· You feel unusually sad or worried. °· You feel nauseous or you vomit. °· You pass large blood clots from your vagina. If you pass a blood clot from your vagina, save it to show to your health care provider. Do not flush blood clots down the toilet without having your health care provider look at them. °· You urinate more than usual. °· You are dizzy or light-headed. °· You have not breastfed at all and you have not had a menstrual period for 12 weeks after delivery. °· You have stopped breastfeeding and you have not had a menstrual period for 12 weeks after you stopped breastfeeding. °Get help right away if: °· You have: °? Pain that does not go away or does not get better with medicine. °? Chest pain. °? Difficulty breathing. °? Blurred vision or spots in your vision. °? Thoughts about hurting yourself or your baby. °· You develop pain in your abdomen or in one of your legs. °· You develop a severe headache. °· You faint. °· You bleed from your vagina so much that you fill two sanitary pads in one hour. °This information is not intended to replace advice given to you by your health care provider. Make sure you discuss any questions you have with your health care provider. °Document Released: 07/12/2000 Document Revised: 12/27/2015 Document Reviewed: 07/30/2015 °[employer] Interactive Patient Education © 2019 [employer] ° °

## 2020-08-08 IMAGING — MR MR PELVIS W/O CM
4 of 8 series · 20 of 48 positions shown · non-contrast
Comparison: 08/02/2018 ultrasound

CLINICAL DATA: Second trimester pregnancy. Right-sided abdominal
pain starting today.

EXAM:
MRI ABDOMEN AND PELVIS WITHOUT CONTRAST
TECHNIQUE: Multiplanar multisequence MR imaging of the abdomen and pelvis was
performed. No intravenous contrast was administered.

[Series 3: T2 · axial · 5.0mm · 0.82mm/px · z∈[-230,+166]mm · 8 of 67 slices shown (1 of 2)]
[im 1/67]
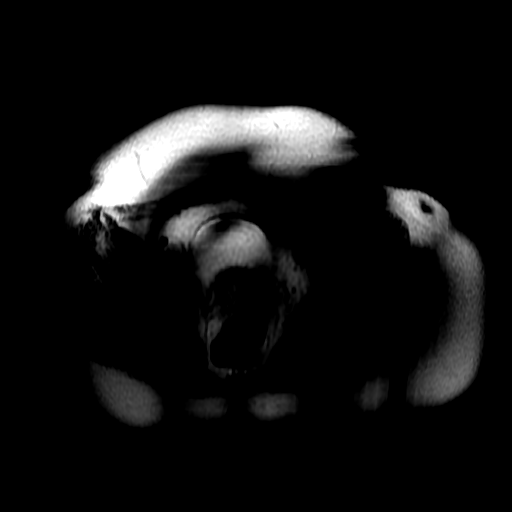
[im 10/67]
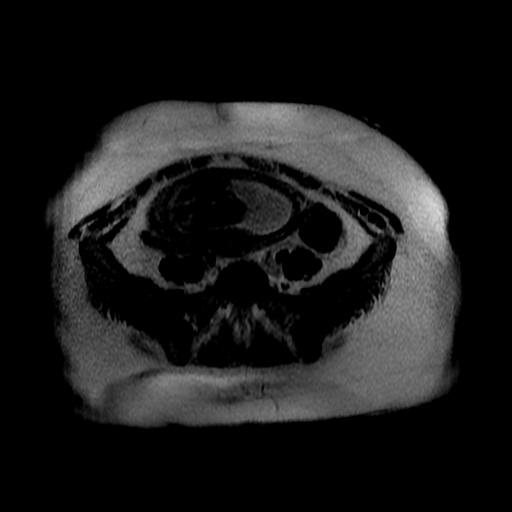
[im 19/67]
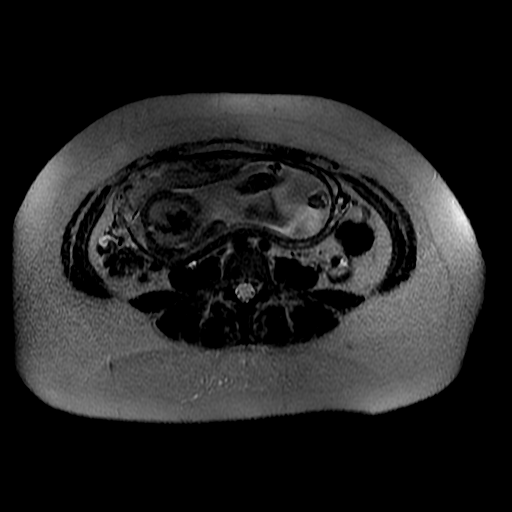
[im 29/67]
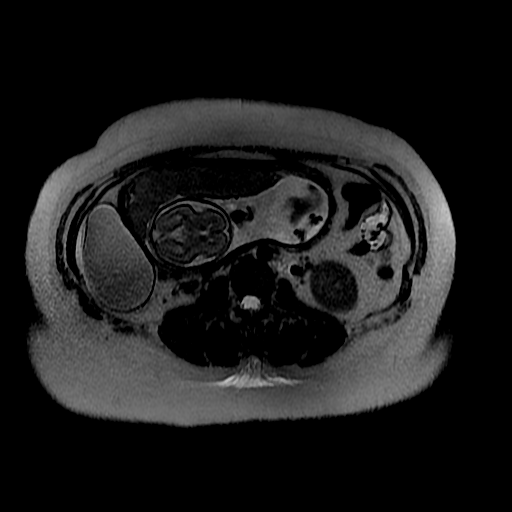
[im 38/67]
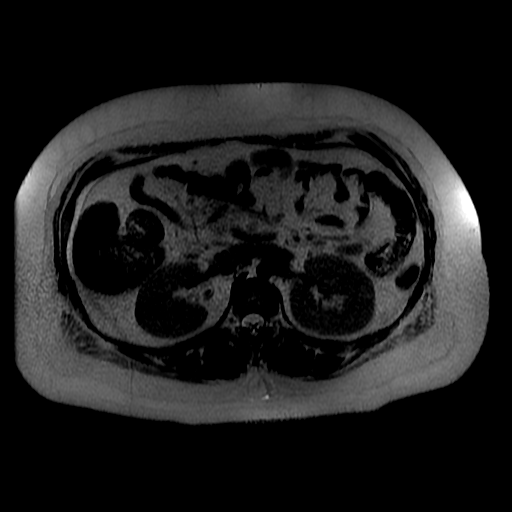
[im 48/67]
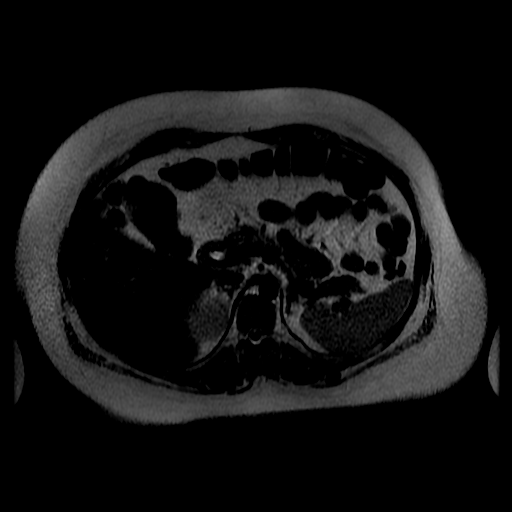
[im 57/67]
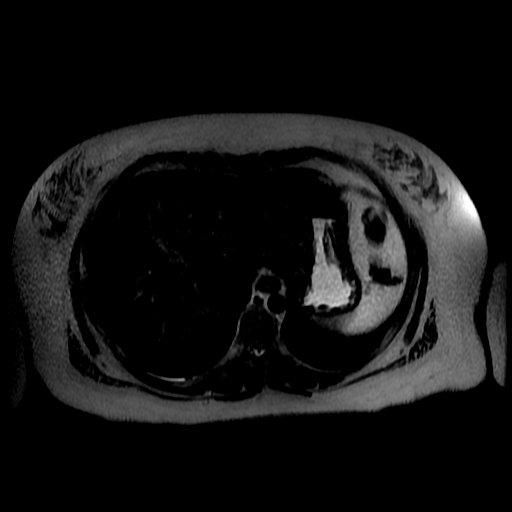
[im 67/67]
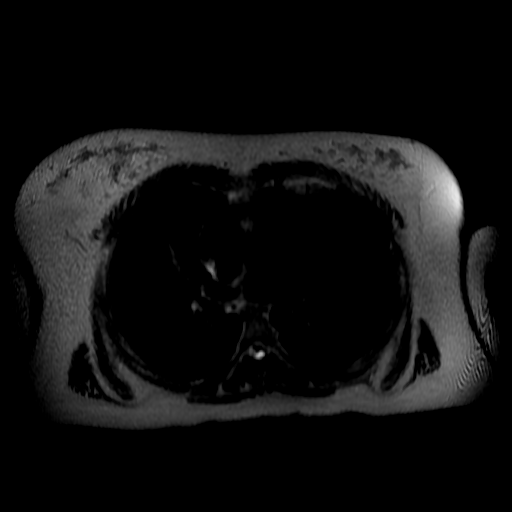

[Series 4: T2 fat-sat · axial · 5.0mm · 0.82mm/px · z∈[-230,+100]mm · 6 of 67 slices shown]
[im 1/67]
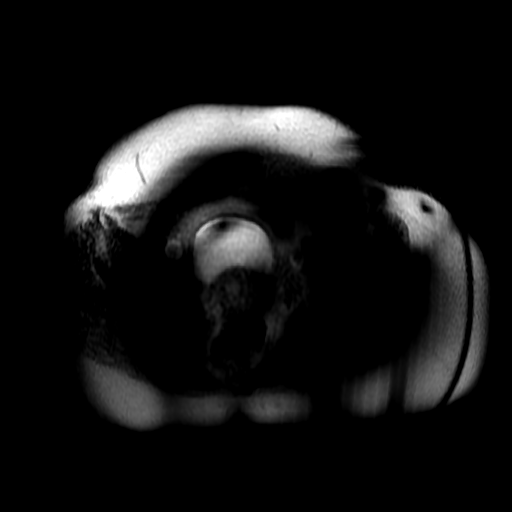
[im 12/67]
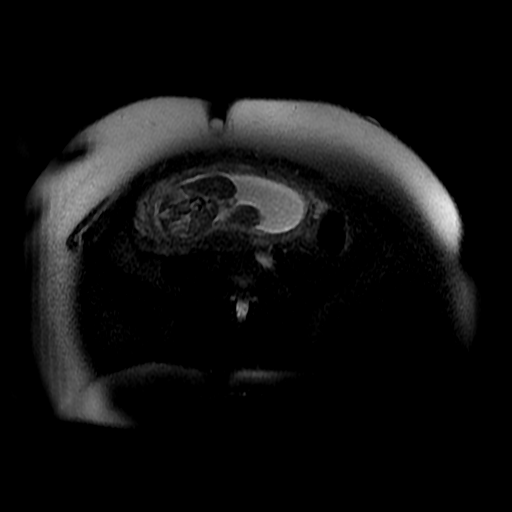
[im 23/67]
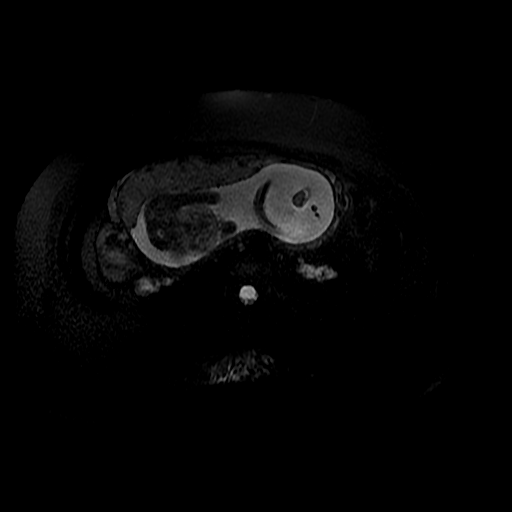
[im 34/67]
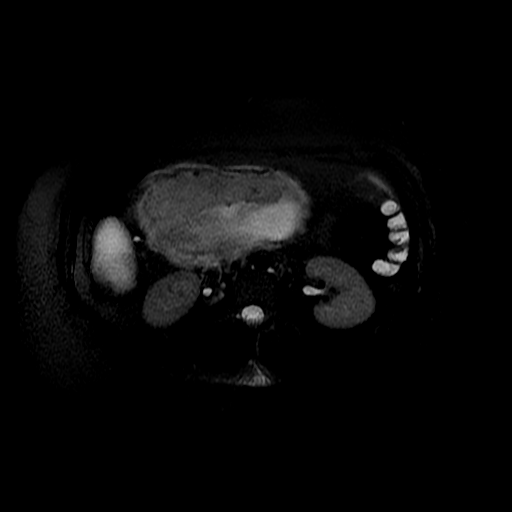
[im 45/67]
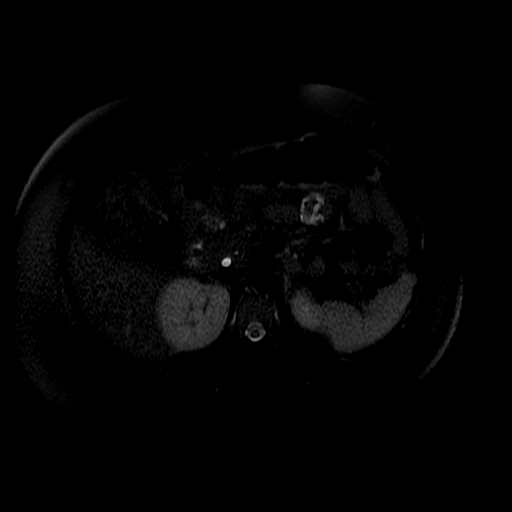
[im 56/67]
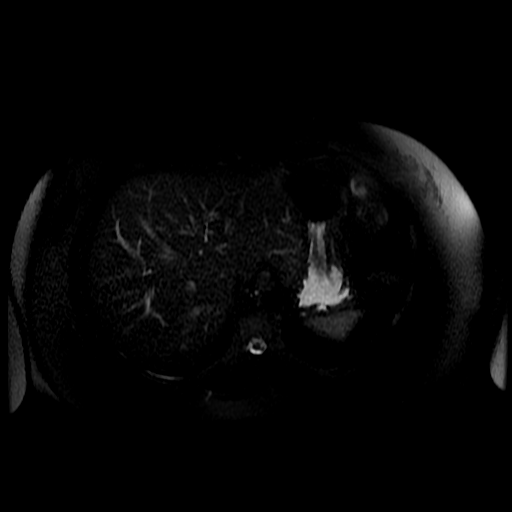

[Series 5: bSSFP fat-sat · axial · 5.0mm · 1.64mm/px · z∈[-164,+100]mm · 3 of 67 slices shown]
[im 12/67]
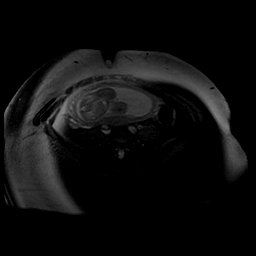
[im 34/67]
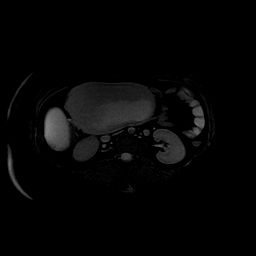
[im 56/67]
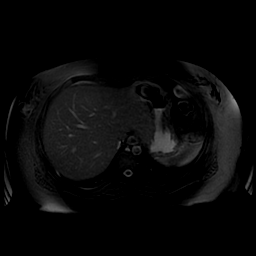

[Series 6: T2 · coronal · 6.0mm · 0.90mm/px · 3 of 40 slices shown (2 of 2)]
[im 1/40]
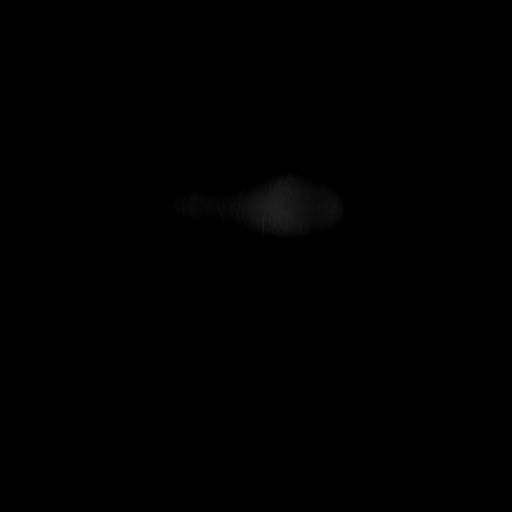
[im 27/40]
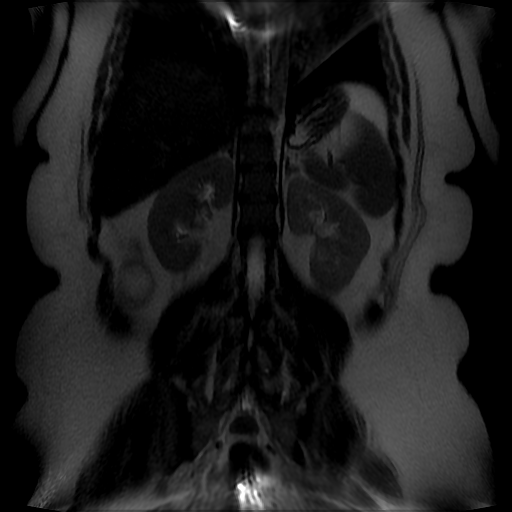
[im 40/40]
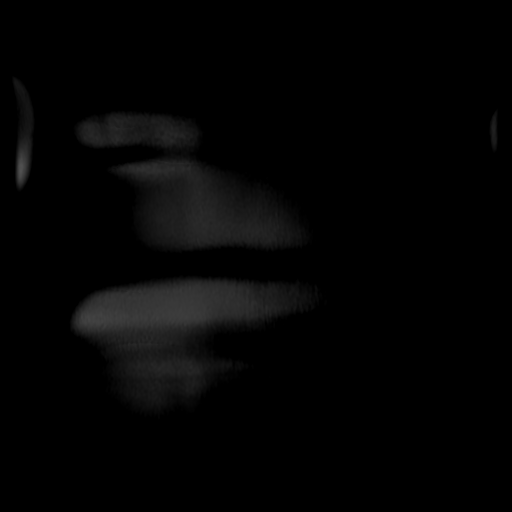

[20 of 48 positions shown; findings below may reference images not displayed]

FINDINGS: COMBINED FINDINGS FOR BOTH MR ABDOMEN AND PELVIS

Lower chest: Upper normal heart size.  The lung bases appear clear.

Hepatobiliary: The common bile duct measures up to 0.7 cm in
diameter although the small prominence is most likely a physiologic
response to cholecystectomy and there is no overt intrahepatic
biliary dilatation. The liver appears otherwise unremarkable.

Pancreas:  Unremarkable

Spleen:  Unremarkable

Adrenals/Urinary Tract: Both adrenal glands appear normal. No
hydronephrosis or appreciable hydroureter.

Stomach/Bowel: A tubular structure adjacent to the medial margin of
the cecum on images 22 through 24 series 10 is thought to represent
a segment of normal appendix. No obvious inflammatory process in
this vicinity to suggest appendicitis. The terminal ileum appears
unremarkable. No dilated bowel.

Vascular/Lymphatic: The IVC is flattened, and the gravid uterus
could be contributing to this example on image 39/5.

Reproductive: There is a cystic lesion along the upper margin of the
right ovary measuring 7.0 by 5.0 by 9.0 cm (volume = 160 cm^3),
with high T2 and low T1 signal characteristics typical for simple
fluid. This has relatively thin wall and no surrounding inflammatory
stranding or free fluid. This is favored to arise from the
ovary/adnexa rather than representing a foregut duplication cyst.

Left ovary unremarkable.

Gravid uterus noted with singleton pregnancy and adequate amniotic
fluid. Anterior and fundal placenta with no previa. Today's exam was
not optimized for fetal assessment. The cervix is closed.

Other:  No supplemental non-categorized findings.

Musculoskeletal: Unremarkable
IMPRESSION: 1. A 160 cubic cm right ovarian or adnexal cyst is present, without
surrounding inflammatory findings or free fluid.
2. A candidate structure believed to represent the appendix appears
normal, without findings of inflammation.
3. No hydronephrosis or hydroureter.
4. Flattened appearance of the IVC could partially be due to the
gravid uterus causing extrinsic narrowing.
